# Patient Record
Sex: Female | Born: 1960 | Race: White | Hispanic: No | Marital: Married
Health system: Northeastern US, Academic
[De-identification: ages and names within clinical notes are randomized; demographics above are authoritative.]

---

## 2019-11-10 ENCOUNTER — Ambulatory Visit: Admit: 2019-11-10 | Payer: PRIVATE HEALTH INSURANCE | Attending: Urology | Primary: Family Medicine

## 2019-11-10 ENCOUNTER — Encounter: Admit: 2019-11-10 | Payer: PRIVATE HEALTH INSURANCE | Attending: Urology | Primary: Family Medicine

## 2019-11-10 DIAGNOSIS — N200 Calculus of kidney: Secondary | ICD-10-CM

## 2019-11-10 DIAGNOSIS — N938 Other specified abnormal uterine and vaginal bleeding: Secondary | ICD-10-CM

## 2019-11-10 DIAGNOSIS — M549 Dorsalgia, unspecified: Secondary | ICD-10-CM

## 2019-11-10 DIAGNOSIS — N926 Irregular menstruation, unspecified: Secondary | ICD-10-CM

## 2019-11-10 DIAGNOSIS — Z87442 Personal history of urinary calculi: Secondary | ICD-10-CM

## 2019-11-10 DIAGNOSIS — R209 Unspecified disturbances of skin sensation: Secondary | ICD-10-CM

## 2019-11-10 DIAGNOSIS — M62830 Muscle spasm of back: Secondary | ICD-10-CM

## 2019-11-10 NOTE — Progress Notes
[facility] + [facility]	Urology Office NoteDate of visit: 2/15/2021Referred by: Scherrie November, MD Reason for consultation:  History of multiple sclerosis and kidney stoneHistory of Present Illness: Joanna Bautista is a 59 y.o. woman with a history of multiple sclerosis.  She has a neurogenic bladder as a result of that.  She had been prescribed Myrbetriq about a year and half ago and that appear to made a really big difference.  She had also had an ultrasound done around that time that suggested kidney stones.She is here to establish care.  She lives locally so would prefer to be seen closer to home.  She and her husband also have a house in [place] that they plan on going to soon.  She denies any change in her urinary symptoms.  The Myrbetriq continues to work well.  She is on a 50 milligram dose of that.  She has not had any flank pain consistent with renal colic.PMH:  has a past medical history of Backache (07/29/2012), Dysfunctional uterine bleeding (01/09/2013), Irregular periods (11/17/2013), Skin sensation disturbance (07/29/2012), and Spasm of back muscles (07/29/2012).PSH:  has a past surgical history that includes Vaginal delivery (04/03/1995) and No past surgeries.Medications: Current Outpatient Medications: ?  adapalene-benzoyl peroxide (EPIDUO) 0.1-2.5 % gel, , Disp: , Rfl: ?  cholecalciferol (VITAMIN D) 1,000 unit tablet, Take by mouth.., Disp: , Rfl: ?  clindamycin (CLEOCIN T) 1 % lotion, , Disp: , Rfl: ?  cyanocobalamin 1000 MCG tablet, Take by mouth.., Disp: , Rfl: ?  dilTIAZem (CARDIZEM CD) 240 mg CD 24 hr extended release capsule, Take 1 capsule (240 mg total) by mouth daily., Disp: 90 capsule, Rfl: 3?  MYRBETRIQ 50 mg extended release tablet 24 hr, TAKE 50 MG BY MOUTH DAILY., Disp: , Rfl: 11?  ocrelizumab (OCREVUS) 30 mg/mL Soln, Inject into the vein.., Disp: , Rfl: ?  RESTASIS 0.05 % ophthalmic emulsion, INSTILL 1 UNIT IN BOTH EYES TWICE A DAY, Disp: , Rfl: 3?  triamcinolone (KENALOG) 0.5 % ointment, APPLY TO AFFECTED AREA TWICE A DAY, Disp: , Rfl: 0?  valACYclovir (VALTREX) 1000 MG tablet, Two tabs bid for one day prn, Disp: 12 tablet, Rfl: 3Allergy: Allergies Allergen Reactions ? Avocado  ? Hay Fever And Allergy Relief  ? Latex, Natural Rubber Rash Social: Social History Socioeconomic History ? Marital status: Married   Spouse name: Not on file ? Number of children: Not on file ? Years of education: Not on file ? Highest education level: Not on file Occupational History ? Not on file Social Needs ? Financial resource strain: Not on file ? Food insecurity   Worry: Not on file   Inability: Not on file ? Transportation needs   Medical: Not on file   Non-medical: Not on file Tobacco Use ? Smoking status: Never Smoker ? Smokeless tobacco: Never Used Substance and Sexual Activity ? Alcohol use: Yes   Comment: social  ? Drug use: No ? Sexual activity: Yes Lifestyle ? Physical activity   Days per week: Not on file   Minutes per session: Not on file ? Stress: Not on file Relationships ? Social Manufacturing systems engineer on phone: Not on file   Gets together: Not on file   Attends religious service: Not on file   Active member of club or organization: Not on file   Attends meetings of clubs or organizations: Not on file   Relationship status: Not on file ? Intimate partner violence   Fear of current or ex partner:  Not on file   Emotionally abused: Not on file   Physically abused: Not on file   Forced sexual activity: Not on file Other Topics Concern ? Not on file Social History Narrative ? Not on file Family: Family History Problem Relation Age of Onset ? Heart disease Other  ? Hypertension Father  ? Sleep apnea Father  ? High cholesterol Father ? Arthritis Father  ? Breast cancer Mother  ? Hypertension Mother  ? High cholesterol Mother  ? Arthritis Mother  Exam: There were no vitals filed for this visit. There is no height or weight on file to calculate BMI.Constitutional & General: Normal development and no deformities. No gross nutritional deficits. Normal habitus.  Well groomed. Well-appearing.Respiratory: No use of accessory muscles. Clear to auscultation bilaterally.Cardiovascular: Regular rate and rhythm. 2+ femoral pulses. No pedal edema. GI/Abdomen: Soft. Non-tender. Non-distended. No mass. No hernia. No scars. No CVA tenderness.Genitourinary:  DeferredLabs: Lab Results Component Value Date  WBC 6.58 09/03/2019  HGB 13.8 09/03/2019  HCT 42.1 09/03/2019  MCV 96.6 09/03/2019  PLT 289 09/03/2019   Chemistry    Component Value Date/Time  NA 137 09/03/2019 1520  K 3.8 09/03/2019 1520  CL 106 09/03/2019 1520  CO2 25 09/03/2019 1520  BUN 18 09/03/2019 1520  CREATININE 0.72 09/03/2019 1520  GLU 87 09/03/2019 1520    Component Value Date/Time  CALCIUM 9.3 09/03/2019 1520  ALKPHOS 101 09/03/2019 1520  AST 18 09/03/2019 1520  ALT 25 09/03/2019 1520  BILITOT 1.0 (H) 09/03/2019 1520  Imaging: I performed a renal ultrasound the office.  There is no evidence of hydronephrosis in either kidney.  Both kidneys were visualized very well.  There is no evidence of renal mass.  There is no evidence of kidney stones although vascular calcifications were noted.  With these calcifications, there was no acoustic shadow a twinkle sign.Impression: Joanna Bautista is a 59 y.o. woman with history of multiple sclerosis, neurogenic bladder, well managed on Myrbetriq.Plan: 1. Neurogenic bladder.  Likely component of detrusor overactivity that has been significantly improved by the beta 3 agonist.  She can continue on this medication.  No additional intervention is necessary at this point.  In addition, her ultrasound demonstrates no hydronephrosis which would certainly suggest good storage in her bladder.2. Question of kidney stones.  I did not see any stones on the ultrasound.  I did see some vascular calcifications and I suspect that perhaps that is what was identified that led to the prior identification of kidney stones.  With that being the case, I do not think she needs to do any specific dietary or fluid intake or medication changes because I do not see any stones.She can follow up in 1 year for ultrasound in the office and symptom check.Tomasa Blase, MDAssistant Professor of UrologyYale Topeka Surgery Center of Medicine[address], [place] [phone]/15/2021

## 2019-11-11 DIAGNOSIS — N3281 Overactive bladder: Secondary | ICD-10-CM

## 2019-12-22 MED ORDER — DILTIAZEM CD 240 MG CAPSULE,EXTENDED RELEASE 24 HR
240 | ORAL_CAPSULE | ORAL | 4 refills | 90.00000 days | Status: AC
Start: 2019-12-22 — End: 2020-10-08

## 2020-03-01 ENCOUNTER — Telehealth: Admit: 2020-03-01 | Payer: PRIVATE HEALTH INSURANCE | Attending: Urology | Primary: Family Medicine

## 2020-03-01 ENCOUNTER — Encounter: Admit: 2020-03-01 | Payer: PRIVATE HEALTH INSURANCE | Attending: Acute Care | Primary: Family Medicine

## 2020-03-01 DIAGNOSIS — N3281 Overactive bladder: Secondary | ICD-10-CM

## 2020-03-01 MED ORDER — MYRBETRIQ 50 MG TABLET,EXTENDED RELEASE
50 mg | ORAL_TABLET | Freq: Every day | ORAL | 4 refills | Status: AC
Start: 2020-03-01 — End: ?

## 2020-03-01 NOTE — Telephone Encounter
Routing message 

## 2020-03-01 NOTE — Telephone Encounter
Patient is requesting a refill on MYRBETRIQ 50 mg extended release tablet 24 hr.  [facility] [place] [facility] patient.

## 2020-03-02 ENCOUNTER — Encounter: Admit: 2020-03-02 | Payer: PRIVATE HEALTH INSURANCE | Primary: Family Medicine

## 2020-03-02 NOTE — Telephone Encounter
Sent mychart message that medication was sent

## 2020-03-02 NOTE — Telephone Encounter
Closing as patient has read her my chart message.

## 2020-03-03 MED ORDER — BEPREVE 1.5 % EYE DROPS
1.5 | OPHTHALMIC | Status: AC
Start: 2020-03-03 — End: ?

## 2020-03-03 MED ORDER — MONTELUKAST 10 MG TABLET
10 | ORAL_TABLET | Freq: Every evening | ORAL | 4 refills | 90.00000 days | Status: AC
Start: 2020-03-03 — End: 2022-03-29

## 2020-03-03 MED ORDER — IBUPROFEN 800 MG TABLET
800 | ORAL | 1.00 refills | 15.00000 days | Status: AC
Start: 2020-03-03 — End: ?

## 2020-08-31 ENCOUNTER — Encounter: Admit: 2020-08-31 | Payer: PRIVATE HEALTH INSURANCE | Attending: Urology | Primary: Family Medicine

## 2020-08-31 NOTE — Telephone Encounter
Found in past medications

## 2020-08-31 NOTE — Telephone Encounter
Patient called in. She needs a 90 day supply prescription [facility] to [facility] for [facility]q. Joanna Bautista

## 2020-09-01 ENCOUNTER — Encounter: Admit: 2020-09-01 | Payer: PRIVATE HEALTH INSURANCE | Attending: Acute Care | Primary: Family Medicine

## 2020-09-01 DIAGNOSIS — N3281 Overactive bladder: Secondary | ICD-10-CM

## 2020-09-01 MED ORDER — MYRBETRIQ 50 MG TABLET,EXTENDED RELEASE
50 mg | ORAL_TABLET | Freq: Every day | ORAL | 6 refills | Status: AC
Start: 2020-09-01 — End: 2020-09-03

## 2020-09-03 ENCOUNTER — Encounter: Admit: 2020-09-03 | Payer: PRIVATE HEALTH INSURANCE | Attending: Acute Care | Primary: Family Medicine

## 2020-09-03 DIAGNOSIS — N3281 Overactive bladder: Secondary | ICD-10-CM

## 2020-09-03 MED ORDER — MYRBETRIQ 50 MG TABLET,EXTENDED RELEASE
50 mg | ORAL_TABLET | Freq: Every day | ORAL | 4 refills | Status: AC
Start: 2020-09-03 — End: ?

## 2020-09-03 NOTE — Telephone Encounter
Joanna Bautista states she needs a correction for the prescription for myrbetriq she states she need 90 day supply, a 30 day, she now needs a 60 day supply [facility] to [facility] [phone] , she already has picked up a 30 day supply however she now 60 day rx needs the full 90 day in order for her insurance to cover. Please advise she can be reached anytime at  [phone], please leave a detailed message if she is unable to answer.

## 2020-09-03 NOTE — Telephone Encounter
Pt is calling regarding a 90day supples pharmacy only gave her 30 her insurance will only cover 90day supple can she have the other 60 day supple called. Once completed please call her at [phone]

## 2020-09-03 NOTE — Telephone Encounter
LM for Marcell Anger Tardiff; additional medication prescription requested has [facility] [place]; please check with pharmacy for availability. Call office back with any questions or concerns.

## 2020-09-06 NOTE — Telephone Encounter
Sp[facility] with [facility]th Arnold [facility]; Aziza Stuckert Pelfrey picked up prescription on 12/10. Closing encounter.

## 2020-10-08 MED ORDER — DILTIAZEM CD 240 MG CAPSULE,EXTENDED RELEASE 24 HR
240 | ORAL_CAPSULE | Freq: Every day | ORAL | 4 refills | 90.00000 days | Status: AC
Start: 2020-10-08 — End: 2021-10-24

## 2020-10-11 ENCOUNTER — Encounter: Admit: 2020-10-11 | Payer: PRIVATE HEALTH INSURANCE | Attending: Urology | Primary: Family Medicine

## 2020-10-11 NOTE — Telephone Encounter
[facility]ed over a new RX refill?request. Please have MD review, sign and fax back. The RX refill?request was attached to this encounter and can be found under media.

## 2020-10-12 NOTE — Telephone Encounter
Will fax once form signed.

## 2020-10-12 NOTE — Telephone Encounter
Form printed and given to Dr. Laveda Norman for signature.

## 2020-10-15 ENCOUNTER — Encounter: Admit: 2020-10-15 | Payer: PRIVATE HEALTH INSURANCE | Attending: Urology | Primary: Family Medicine

## 2020-11-30 MED ORDER — LISINOPRIL 10 MG TABLET
10 | ORAL_TABLET | Freq: Every day | ORAL | 4 refills | 90.00000 days | Status: AC
Start: 2020-11-30 — End: ?

## 2021-01-25 ENCOUNTER — Encounter: Admit: 2021-01-25 | Payer: PRIVATE HEALTH INSURANCE | Primary: Family Medicine

## 2021-02-28 ENCOUNTER — Ambulatory Visit: Admit: 2021-02-28 | Payer: PRIVATE HEALTH INSURANCE | Attending: Urology | Primary: Family Medicine

## 2021-03-02 ENCOUNTER — Ambulatory Visit: Admit: 2021-03-02 | Payer: PRIVATE HEALTH INSURANCE | Attending: Urology | Primary: Family Medicine

## 2021-03-14 ENCOUNTER — Telehealth: Admit: 2021-03-14 | Payer: PRIVATE HEALTH INSURANCE | Attending: Urology | Primary: Family Medicine

## 2021-03-14 NOTE — Telephone Encounter
Spoke with Joanna Bautista; she and her husband just tested positive for Covid. Will cancel 6/22 appt and she will call back to reschedule when feeling better.

## 2021-03-15 ENCOUNTER — Inpatient Hospital Stay: Admit: 2021-03-15 | Discharge: 2021-03-15 | Payer: PRIVATE HEALTH INSURANCE

## 2021-03-15 MED ORDER — SODIUM CHLORIDE 0.9 % (FLUSH) INJECTION SYRINGE
0.9 % | Status: DC | PRN
Start: 2021-03-15 — End: 2021-03-15

## 2021-03-15 MED ORDER — EPINEPHRINE 0.3 MG/0.3 ML INJECTION, AUTO-INJECTOR
0.3 mg/ mL | INTRAMUSCULAR | Status: DC | PRN
Start: 2021-03-15 — End: 2021-03-15

## 2021-03-15 MED ORDER — FAMOTIDINE 4 MG/ML IN 0.9% SODIUM CHLORIDE (ADULT)
Freq: Once | INTRAVENOUS | Status: DC | PRN
Start: 2021-03-15 — End: 2021-03-15

## 2021-03-15 MED ORDER — DIPHENHYDRAMINE 50 MG/ML INJECTION (WRAPPED E-RX)
50 mg/mL | Freq: Once | INTRAVENOUS | Status: DC | PRN
Start: 2021-03-15 — End: 2021-03-15

## 2021-03-15 MED ORDER — SODIUM CHLORIDE 0.9 % BOLUS (NEW BAG)
0.9 % | Freq: Once | INTRAVENOUS | Status: DC | PRN
Start: 2021-03-15 — End: 2021-03-15

## 2021-03-15 MED ORDER — ALBUTEROL SULFATE HFA 90 MCG/ACTUATION AEROSOL INHALER
90 mcg/actuation | RESPIRATORY_TRACT | Status: DC | PRN
Start: 2021-03-15 — End: 2021-03-15

## 2021-03-15 MED ORDER — BEBTELOVIMAB 175 MG/2 ML (87.5 MG/ML) INTRAVENOUS SOLUTION (UNAPP)
175 mg/2 mL (87.5 mg/mL) | Freq: Once | INTRAVENOUS | Status: CP
Start: 2021-03-15 — End: ?
  Administered 2021-03-15: 13:00:00 175 mL via INTRAVENOUS

## 2021-03-15 MED ORDER — HYDROCORTISONE SODIUM SUCCINATE 100 MG SOLUTION FOR INJECTION
100 mg | Freq: Once | INTRAVENOUS | Status: DC | PRN
Start: 2021-03-15 — End: 2021-03-15

## 2021-03-15 NOTE — ED Notes
9:36 AM pt tolerated med without difficulty. Vss. Ready for discharge.

## 2021-03-16 ENCOUNTER — Ambulatory Visit: Admit: 2021-03-16 | Payer: PRIVATE HEALTH INSURANCE | Attending: Urology | Primary: Family Medicine

## 2021-04-16 ENCOUNTER — Emergency Department: Admit: 2021-04-16 | Payer: PRIVATE HEALTH INSURANCE | Primary: Family Medicine

## 2021-04-16 ENCOUNTER — Inpatient Hospital Stay: Admit: 2021-04-16 | Discharge: 2021-04-16 | Payer: PRIVATE HEALTH INSURANCE

## 2021-04-16 ENCOUNTER — Encounter: Admit: 2021-04-16 | Payer: PRIVATE HEALTH INSURANCE | Primary: Family Medicine

## 2021-04-16 DIAGNOSIS — Z91018 Allergy to other foods: Secondary | ICD-10-CM

## 2021-04-16 DIAGNOSIS — Z8249 Family history of ischemic heart disease and other diseases of the circulatory system: Secondary | ICD-10-CM

## 2021-04-16 DIAGNOSIS — M549 Dorsalgia, unspecified: Secondary | ICD-10-CM

## 2021-04-16 DIAGNOSIS — Z91040 Latex allergy status: Secondary | ICD-10-CM

## 2021-04-16 DIAGNOSIS — M62830 Muscle spasm of back: Secondary | ICD-10-CM

## 2021-04-16 DIAGNOSIS — N938 Other specified abnormal uterine and vaginal bleeding: Secondary | ICD-10-CM

## 2021-04-16 DIAGNOSIS — R209 Unspecified disturbances of skin sensation: Secondary | ICD-10-CM

## 2021-04-16 DIAGNOSIS — N926 Irregular menstruation, unspecified: Secondary | ICD-10-CM

## 2021-04-16 DIAGNOSIS — Z8616 Personal history of COVID-19: Secondary | ICD-10-CM

## 2021-04-16 DIAGNOSIS — H9209 Otalgia, unspecified ear: Secondary | ICD-10-CM

## 2021-04-16 DIAGNOSIS — R0602 Shortness of breath: Secondary | ICD-10-CM

## 2021-04-16 DIAGNOSIS — R6884 Jaw pain: Secondary | ICD-10-CM

## 2021-04-16 DIAGNOSIS — R079 Chest pain, unspecified: Secondary | ICD-10-CM

## 2021-04-16 DIAGNOSIS — Z79899 Other long term (current) drug therapy: Secondary | ICD-10-CM

## 2021-04-16 LAB — BASIC METABOLIC PANEL
BKR ANION GAP (LM): 5 mmol/L (ref 5–15)
BKR BLOOD UREA NITROGEN: 10 mg/dL (ref 7–18)
BKR CALCIUM: 9.3 mg/dL (ref 8.5–10.1)
BKR CHLORIDE: 103 mmol/L (ref 98–107)
BKR CO2: 26 mmol/L (ref 21–32)
BKR CREATININE: 0.7 mg/dL (ref 0.55–1.02)
BKR EGFR (AFR AMER) (LMC): >60 mL/min/{1.73_m2} (ref >60)
BKR EGFR (NON AFR AMER) (LMC): >60 mL/min/{1.73_m2} (ref >60)
BKR GLUCOSE: 96 mg/dL (ref 65–110)
BKR POTASSIUM: 3.8 mmol/L (ref 3.5–5.1)
BKR SODIUM: 134 mmol/L — ABNORMAL LOW (ref 136–145)

## 2021-04-16 LAB — CBC WITH AUTO DIFFERENTIAL
BKR WAM ABSOLUTE IMMATURE GRANULOCYTES.: 0.01 x 1000/ÂµL (ref 0.00–0.30)
BKR WAM ABSOLUTE LYMPHOCYTE COUNT.: 1.55 x 1000/ÂµL (ref 0.60–3.70)
BKR WAM ABSOLUTE NEUTROPHIL COUNT.: 4.96 x 1000/ÂµL (ref 2.00–7.60)
BKR WAM BASOPHIL ABSOLUTE COUNT.: 0.03 x 1000/ÂµL (ref 0.00–1.00)
BKR WAM BASOPHILS: 0.4 % (ref 0.0–1.4)
BKR WAM EOSINOPHIL ABSOLUTE COUNT.: 0.01 x 1000/ÂµL (ref 0.00–1.00)
BKR WAM EOSINOPHILS: 0.1 % (ref 0.0–5.0)
BKR WAM HEMATOCRIT (2 DEC): 41.6 % (ref 35.00–45.00)
BKR WAM HEMOGLOBIN: 13.9 g/dL (ref 11.7–15.5)
BKR WAM IMMATURE GRANULOCYTES: 0.1 % (ref 0.0–1.0)
BKR WAM LYMPHOCYTES: 21.6 % (ref 17.0–50.0)
BKR WAM MCH PG: 32.2 pg (ref 27.0–33.0)
BKR WAM MCHC: 33.4 g/dL (ref 31.0–36.0)
BKR WAM MCV: 96.3 fL (ref 80.0–100.0)
BKR WAM MONOCYTE ABSOLUTE COUNT.: 0.6 x 1000/ÂµL (ref 0.00–1.00)
BKR WAM MONOCYTES: 8.4 % (ref 4.0–12.0)
BKR WAM MPV: 9.8 fL (ref 8.0–12.0)
BKR WAM NEUTROPHILS: 69.4 % (ref 39.0–72.0)
BKR WAM NUCLEATED RED BLOOD CELLS: 0 % (ref 0.0–1.0)
BKR WAM PLATELETS: 290 x1000/ÂµL (ref 150–420)
BKR WAM RDW-CV: 11.8 % (ref 11.0–15.0)
BKR WAM RED BLOOD CELL COUNT.: 4.32 M/ÂµL (ref 4.00–6.00)
BKR WAM WHITE BLOOD CELL COUNT: 7.2 x1000/ÂµL (ref 4.0–11.0)

## 2021-04-16 LAB — TROPONIN T HIGH SENSITIVITY, 0 HOUR BASELINE WITH REFLEX (BH GH LMW YH): BKR TROPONIN T HS 0 HOUR BASELINE: <6 ng/L

## 2021-04-16 MED ORDER — CLINDAMYCIN HCL 150 MG CAPSULE
150 mg | ORAL_CAPSULE | Freq: Four times a day (QID) | ORAL | 1 refills | Status: AC
Start: 2021-04-16 — End: ?

## 2021-04-16 NOTE — Discharge Instructions
Continue use of albuterol inhaler and Flonase. Take all medications as prescribed. Follow up as instructed. Seek immediate medical attention for the following:  Fever, chills, swelling of the face mouth or throat, difficulty swallowing, difficulty breathing, chest pain, abdominal pain, nausea or vomiting, or any concerns.

## 2021-04-16 NOTE — ED Notes
6:04 PM PA Hoover at bedside for eval

## 2021-04-16 NOTE — ED Provider Notes
CHIEF COMPLAINTEar problem and chest pain HPIAngela C Bautista is a 60 y.o. female  who presents to the ED c/o ear problem and chest pain.  Patient presents with history of COVID-19 infection proximally 4 weeks ago.  Patient reports intermittent coughing with feeling shortness of breath and need for use of inhaler.  Patient also reports ongoing evaluation for dental infection with need for root canal and extraction to the left lower molar.  Patient reports ongoing evaluation by dentist with recent 7 day course of antibiotics.  Patient also reports history of chronic sinusitis with nasal congestion postnasal drip with ear fullness.  Patient reports use of over-the-counter medications for management but denies prior ENT evaluation.  Patient presents today with concern for left-sided jaw pain with fever.  States over the past few days she has been not feel like she was harboring something.  Reports negative COVID test as outpatient.  Reports a fever of approximately 100F at home with use of Tylenol.  Reports history of MS with immunotherapy with concern for potential systemic infection and therefore came to be evaluated.  Patient reports mild nausea associated with symptoms.  Denies abdominal pain, emesis, diarrhea, or change in urination.  Denies chest pain or difficulty breathing at this time.  Denies facial or neck fullness, swelling, drooling, or difficulty swallowing. I have reviewed the following from the nursing documentation.  Past Medical History: Diagnosis Date ? Backache 07/29/2012 ? Dysfunctional uterine bleeding 01/09/2013 ? Irregular periods 11/17/2013 ? Skin sensation disturbance 07/29/2012 ? Spasm of back muscles 07/29/2012 Past Surgical History: Procedure Laterality Date ? NO PAST SURGERIES   ? VAGINAL DELIVERY  04/03/1995  L&M Family History Problem Relation Age of Onset ? Heart disease Other  ? Hypertension Father  ? Sleep apnea Father  ? High cholesterol Father  ? Arthritis Father  ? Breast cancer Mother  ? Hypertension Mother  ? High cholesterol Mother  ? Arthritis Mother  Social History Socioeconomic History ? Marital status: Married   Spouse name: Not on file ? Number of children: Not on file ? Years of education: Not on file ? Highest education level: Not on file Occupational History ? Not on file Tobacco Use ? Smoking status: Never Smoker ? Smokeless tobacco: Never Used Vaping Use ? Vaping Use: Never used Substance and Sexual Activity ? Alcohol use: Yes   Comment: social  ? Drug use: No ? Sexual activity: Yes Other Topics Concern ? Not on file Social History Narrative ? Not on file Social Determinants of Health Financial Resource Strain: Not on file Food Insecurity: Not on file Transportation Needs: Not on file Physical Activity: Not on file Stress: Not on file Social Connections: Not on file Intimate Partner Violence: Not on file Housing Stability: Not on file No current facility-administered medications for this encounter. Current Outpatient Medications Medication Sig Dispense Refill ? adapalene-benzoyl peroxide (EPIDUO) 0.1-2.5 % gel    ? BEPREVE 1.5 % Drop INSTILL 1 DROP INTO BOTH EYES TWICE A DAY   ? cholecalciferol (VITAMIN D) 1,000 unit tablet Take by mouth..   ? clindamycin (CLEOCIN T) 1 % lotion    ? clindamycin (CLEOCIN) 150 mg capsule Take 3 capsules (450 mg total) by mouth every 6 (six) hours for 7 days. 84 capsule 0 ? cyanocobalamin 1000 MCG tablet Take by mouth..   ? dilTIAZem CD (CARDIZEM CD) 240 mg 24 hr capsule Take 1 capsule (240 mg total) by mouth daily. 90 capsule 3 ? ibuprofen (ADVIL,MOTRIN) 800 mg tablet TAKE 1 TABLET BY  MOUTH EVERY 8 HOURS AS NEEDED FOR PAIN   ? lisinopriL (PRINIVIL,ZESTRIL) 10 mg tablet Take 1 tablet (10 mg total) by mouth daily. (Patient taking differently: Take 20 mg by mouth daily.) 90 tablet 3 ? mirabegron (MYRBETRIQ) 50 mg extended release tablet 24 hr Take 1 tablet (50 mg total) by mouth daily. (Patient taking differently: Take 25 mg by mouth daily.) 90 tablet 3 ? montelukast (SINGULAIR) 10 mg tablet Take 1 tablet (10 mg total) by mouth nightly. (Patient not taking: Reported on 03/29/2021) 30 tablet 3 ? ocrelizumab (OCREVUS) 30 mg/mL Soln Inject into the vein..   ? RESTASIS 0.05 % ophthalmic emulsion INSTILL 1 UNIT IN BOTH EYES TWICE A DAY  3 ? triamcinolone (KENALOG) 0.5 % ointment APPLY TO AFFECTED AREA TWICE A DAY  0 ? valACYclovir (VALTREX) 1000 MG tablet Two tabs bid for one day prn 12 tablet 3 Allergies Allergen Reactions ? Avocado  ? Hay Fever And Allergy Relief  ? Latex, Natural Rubber Rash ROSA total of 6 systems have been reviewed. All positives and pertinent negatives as per the HPI. PHYSICAL EXAMBP (!) 150/67  - Pulse 74  - Temp 98.6 ?F (37 ?C) (Oral)  - Resp 20  - SpO2 96%  GENERAL APPEARANCE: A 60 y.o., alert and well developed female in no acute distress. HEENT: Normocephalic. Atraumatic. Eyes are anicteric with no injection. No facial asymmetry. Neck is supple with full range of motion.  No gingival edema, dental abscess or dental carie noted.  No trismus.  Able to tolerate oral secretions.  Tympanic membranes pearly gray without effusion, erythema or bulging bilaterally.  No nuchal rigidity.Cardiovascular: Heart sounds S1 and S2 are present with regular rate and rhythm. No murmurs, gallops or rubs are heard upon auscultation. Good capillary refill with no evidence of cyanosis.  Radial peripheral pulses present and equal 2/4 bilaterally. LUNGS: Respirations unlabored. Breath sounds are clear and equal bilaterally on auscultation with no adventitious sounds. ABDOMEN: Soft, non-tender without guarding or peritoneal signs. No organomegaly noted.MUSCULOSKELETAL: Atraumatic x4. No peripheral edema. SKIN: Warm and dry without acute ecchymosis or erythema. NEUROLOGICAL: Alert and oriented x4 with no gross sensory deficits.PSYCHIATRIC: Calm and pleasant affect, cooperative and interactive with staff.ED COURSE:The patient was stable during the ED course. Relevant old records, labs and imaging are reviewed. During the patient's ED course, the patient was given:Medications - No data to display LABSI have reviewed all available labs for this visit prior to discharge.Results for orders placed or performed during the hospital encounter of 04/16/21 Troponin T High Sensitivity, Emergency; 0 hour baseline AND 1 hour with reflex (3 hour) Result Value Ref Range  High Sensitivity Troponin T <6 See Comment ng/L CBC auto differential Result Value Ref Range  WBC 7.2 4.0 - 11.0 x1000/?L  RBC 4.32 4.00 - 6.00 M/?L  Hemoglobin 13.9 11.7 - 15.5 g/dL  Hematocrit 16.10 96.04 - 45.00 %  MCV 96.3 80.0 - 100.0 fL  MCH 32.2 27.0 - 33.0 pg  MCHC 33.4 31.0 - 36.0 g/dL  RDW-CV 54.0 98.1 - 19.1 %  Platelets 290 150 - 420 x1000/?L  MPV 9.8 8.0 - 12.0 fL  Neutrophils 69.4 39.0 - 72.0 %  Lymphocytes 21.6 17.0 - 50.0 %  Monocytes 8.4 4.0 - 12.0 %  Eosinophils 0.1 0.0 - 5.0 %  Basophil 0.4 0.0 - 1.4 %  Immature Granulocytes 0.1 0.0 - 1.0 %  nRBC 0.0 0.0 - 1.0 %  ANC (Abs Neutrophil Count) 4.96 2.00 - 7.60 x 1000/?L  Absolute Lymphocyte  Count 1.55 0.60 - 3.70 x 1000/?L  Monocyte Absolute Count 0.60 0.00 - 1.00 x 1000/?L  Eosinophil Absolute Count 0.01 0.00 - 1.00 x 1000/?L  Basophil Absolute Count 0.03 0.00 - 1.00 x 1000/?L  Absolute Immature Granulocyte Count 0.01 0.00 - 0.30 x 1000/?L Basic metabolic panel Result Value Ref Range  Glucose 96 65 - 110 mg/dL  BUN 10 7 - 18 mg/dL  Creatinine 2.13 0.86 - 1.02 mg/dL  eGFR (AFRICAN-AMERICAN) >60 >60 mL/min/1.61m2  eGFR (NON African-American) >60 >60 mL/min/1.37m2  Sodium 134 (L) 136 - 145 mmol/L  Potassium 3.8 3.5 - 5.1 mmol/L  Chloride 103 98 - 107 mmol/L  CO2 26 21 - 32 mmol/L  Anion Gap 5 5 - 15 mmol/L  Calcium 9.3 8.5 - 10.1 mg/dL EKG Result Value Ref Range  ECG - HEART RATE 92 bpm  ECG - QRS Interval 82 ms  ECG - QT Interval 332 ms  ECG - QTC Interval 411 ms  ECG - P Axis 85 deg  ECG - QRS Axis 29 deg  ECG - T Wave Axis 15 deg  ECG -- P-R Interval 108 msec  ECG - SEVERITY Abnormal ECG severity  RADIOLOGYCXR reviewed by myself without evidence of acute pathology.EKG Reviewed with evidence of sinus tachycardia with PACs at 92 beats per minute.  PR interval 108.  QTC 411.  No evidence of STEMI or ischemia.  Reviewed by attending Dr. Biagio Quint. MDM:The patient presents c/o ear problem and chest pain. PMH reviewed with significant findings of MS.  Prior cardiac evaluation from March of 2022 reviewed with negative stress test. Patient is afebrile, nontoxic and in no acute distress.  Physical examination as detailed above. No evidence of dental abscess requiring incision drainage or Ludwig's angina.  Lungs clear bilaterally.  No evidence of acute respiratory distress.  Chest x-ray reviewed without evidence of acute pathology.  CBC and BMP unremarkable.  Initial troponin <6 and modified heart score 2. With symptoms ongoing for >3 hours no indication for further testing per [facility] cardiac pathway and acute MI ruled out at this time.  Concern for potential ongoing dental infection with plan for secondary course of clindamycin.  Patient additionally advised on supportive care for shortness breath and cough with use of inhaler.  Patient provided with dental and cardiac referral. Advised on close follow up with PCP. Patient discharged home with close follow up planned with PCP within 4 days. Indications for return reviewed with patient, who was in agreement with this plan. ED CourseCLINICAL IMPRESSION  SNOMED [place](R) 1. Jaw pain  JAW PAIN DISPOSITIONAngela C Bautista was discharged to home in stable condition.They have been instructed to follow-up with Scherrie November, MD[address] [phone] an appointment as soon as possible for a visit in 4 daysSanfilippo, [place], DMD[address] [place] [phone] your dentistVelankar, Placido Sou, MD[address] [phone] an appointment as soon as possible for a visit Cardiology follow upSupervision:This patient was seen independently.My supervising physician was available for consultation. DISCLAIMER: This chart was created using M-Modal dictation software. Efforts were made by me to ensure accuracy, however some errors may be present due to limitations of this technology and occasionally words are not transcribed as intended. Daron Offer, PA07/23/22 [phone]

## 2021-04-27 MED ORDER — ALPRAZOLAM 0.5 MG TABLET
0.5 | ORAL_TABLET | ORAL | 1 refills | 25.00000 days | Status: AC
Start: 2021-04-27 — End: 2022-03-29

## 2021-08-31 IMAGING — MG MAMMOGRAPHY SCREENING BILATERAL 3[PERSON_NAME]
8 series · 9 of 24 positions shown · non-contrast
Comparison: Comparison was made to prior examinations.

________________________________________________________________________________________________ 
MAMMOGRAPHY SCREENING BILATERAL 3ARE PUSEY, 08/31/2021 [DATE]: 
CLINICAL INDICATION: Screening.
TECHNIQUE: Digital bilateral mammograms and 3-D Tomosynthesis were obtained. 
These were interpreted both primarily and with the aid of computer-aided 
detection system.  
BREAST DENSITY: (Level B) There are scattered areas of fibroglandular density.

[R CC]
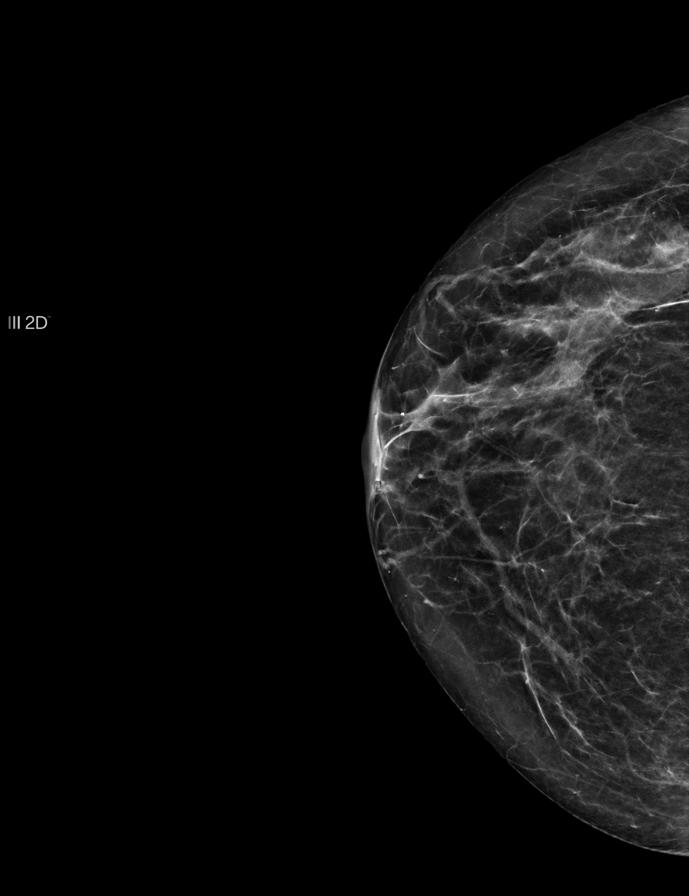

[R MLO]
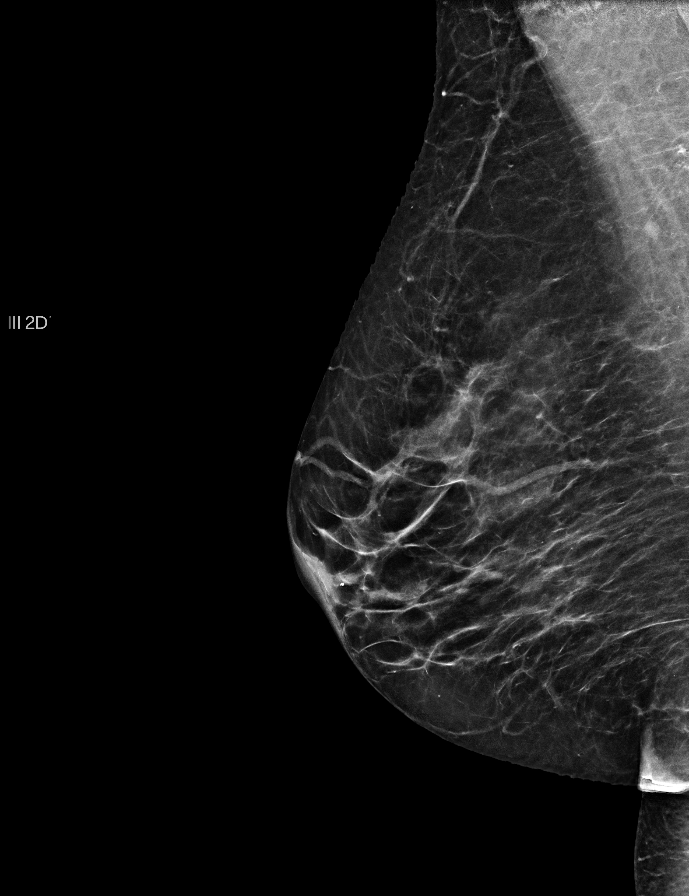

[L MLO]
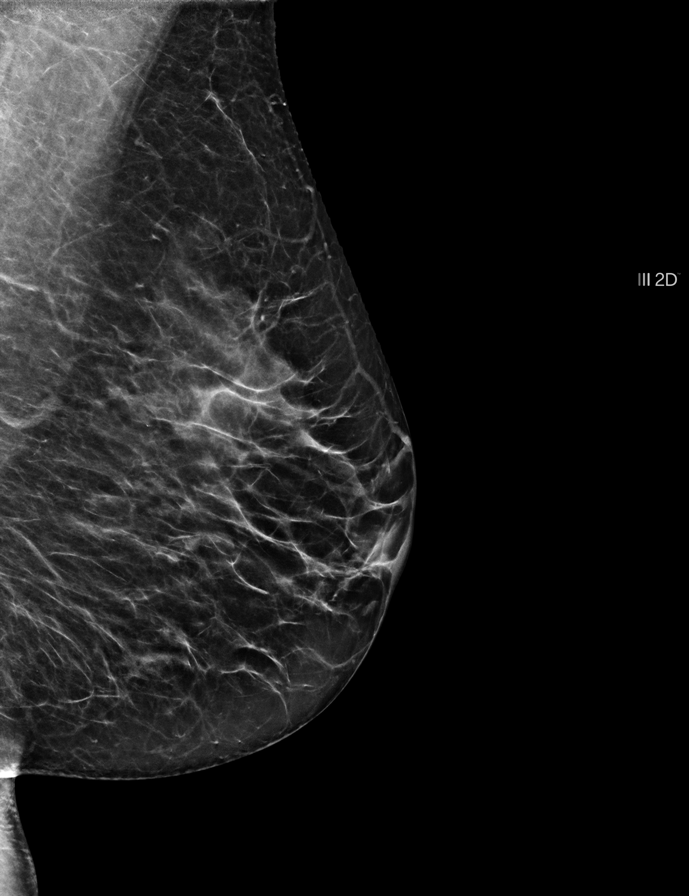

[L CC]
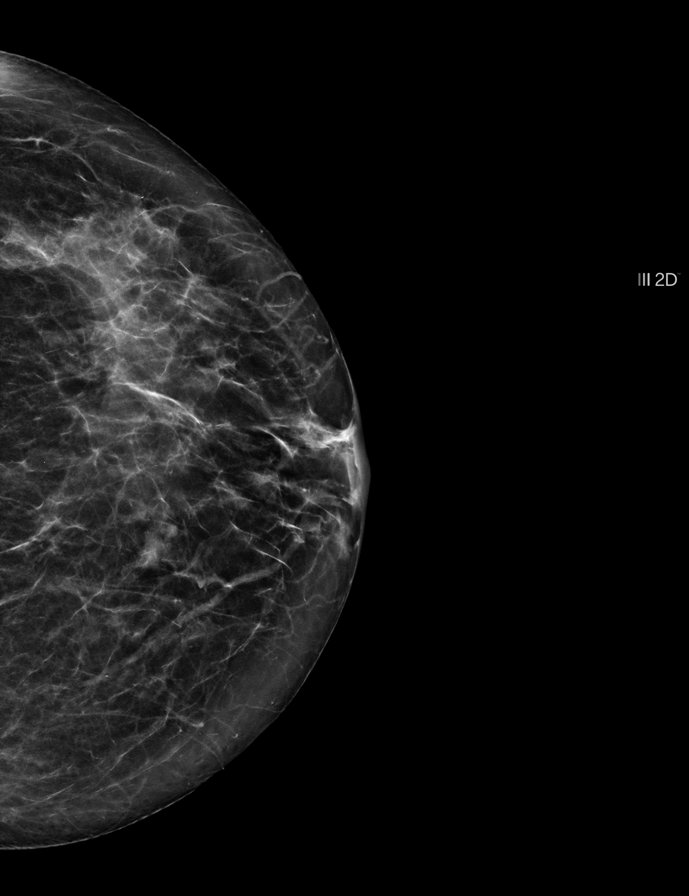

[R CC tomo · 2 of 45 frames shown]
[frame 15/45]
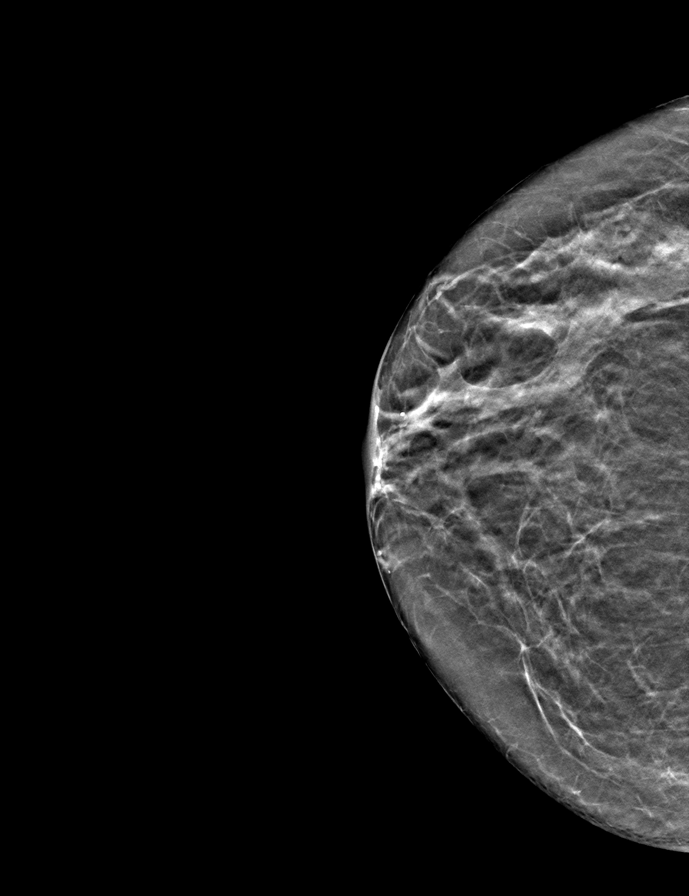
[frame 23/45]
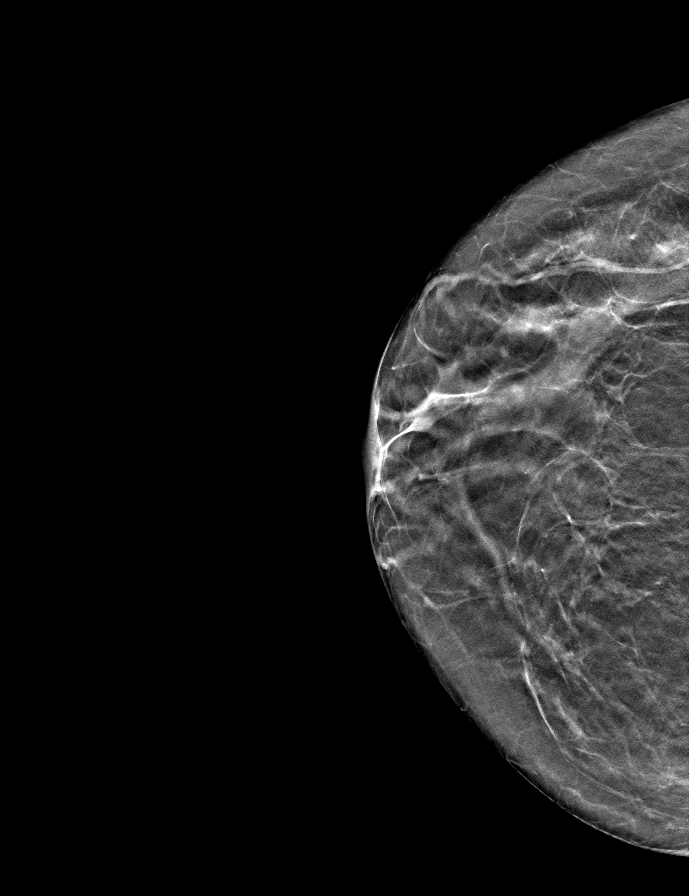

[L CC tomo · tomo slice 23/45.0]
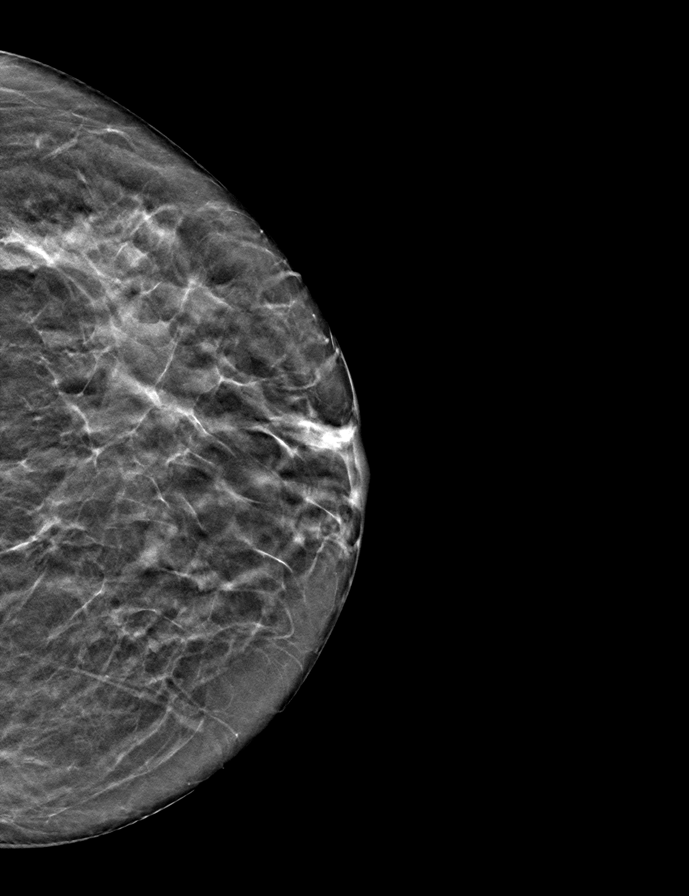

[L MLO tomo · tomo slice 27/54.0]
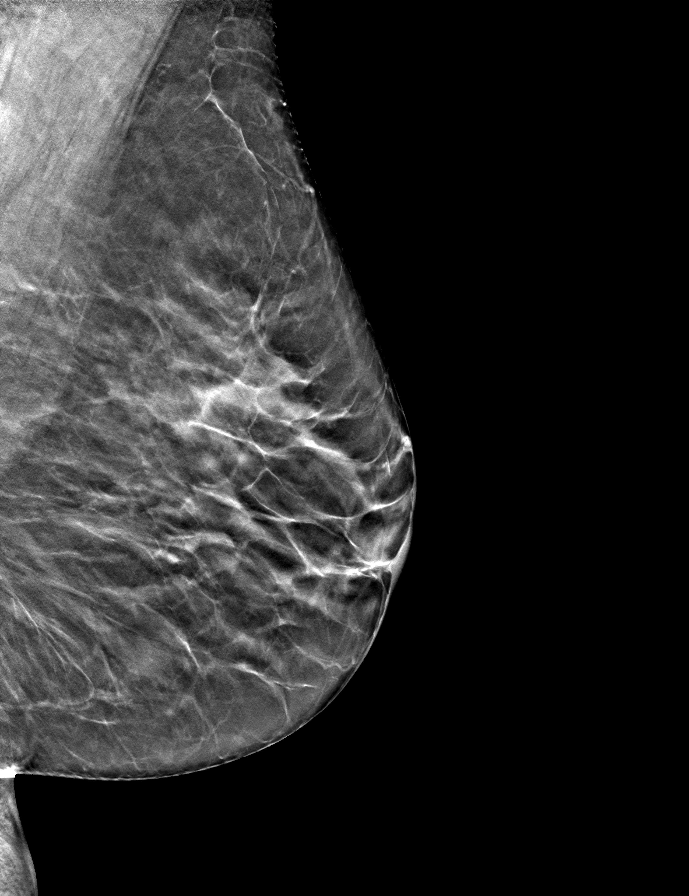

[R MLO tomo · tomo slice 27/52.0]
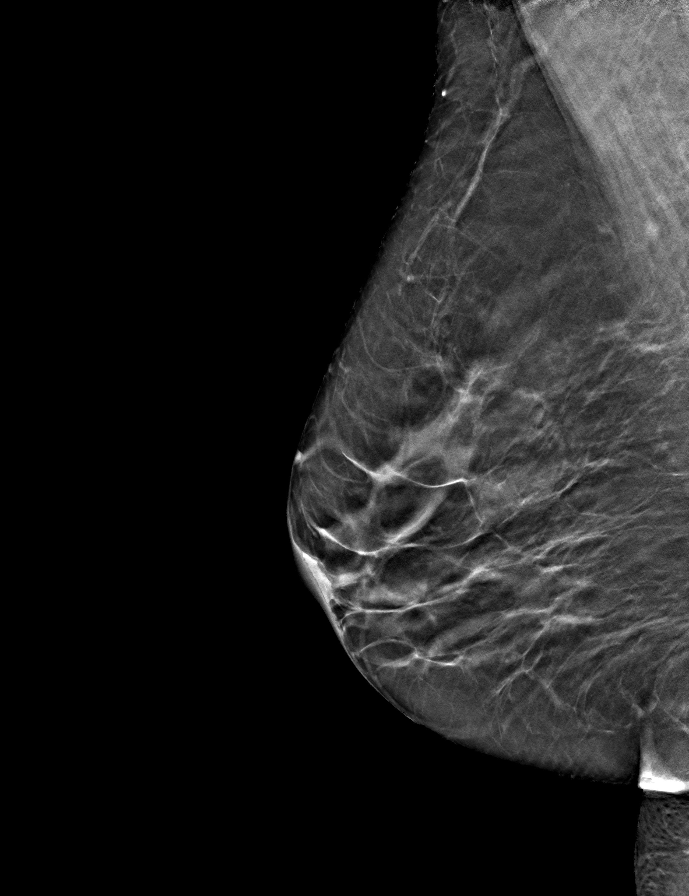

[9 of 24 positions shown; findings below may reference images not displayed]

FINDINGS: Benign calcification. No suspicious mass, calcifications, or area of 
architectural distortion in either breast.
IMPRESSION: Stable mammogram. 
(BI-RADS 2) Benign findings. Routine mammographic follow-up is recommended.

## 2021-08-31 IMAGING — MR MRI CERVICAL SPINE WITHOUT CONTRAST
5 of 7 series · 22 of 48 positions shown · IV contrast (gadolinium)
Comparison: Cervical MRI November 03, 2019 outside facility

________________________________________________________________________________________________ 
MRI CERVICAL SPINE WITHOUT CONTRAST, 08/31/2021 [DATE]: 
CLINICAL INDICATION: Multiple sclerosis diagnosed 10 years ago. No new symptoms
TECHNIQUE: Sagittal T1, Sagittal T2, Sagittal STIR, Axial TSE and Axial [id number] 
images of the cervical spine were performed without intravenous gadolinium 
enhancement.

[Series 101: survey* · axial · 10.0mm · 0.94mm/px · z∈[-30,+150]mm · 6 of 15 slices shown]
[im 1/15]
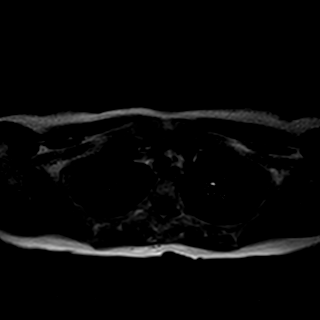
[im 3/15]
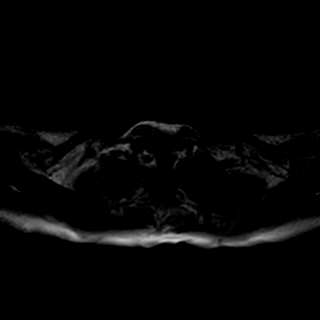
[im 6/15]
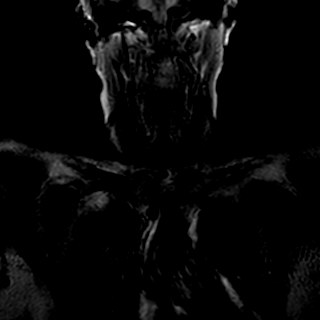
[im 9/15]
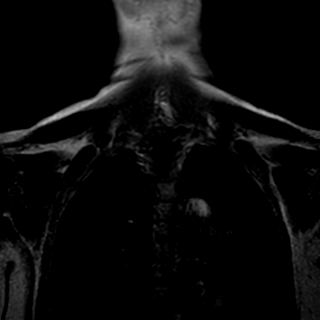
[im 12/15]
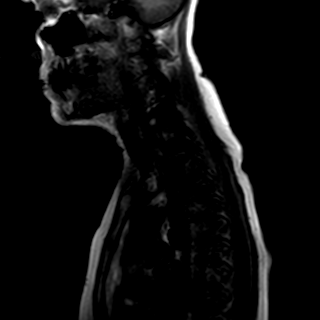
[im 15/15]
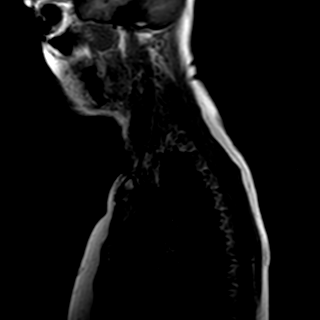

[Series 201: t2w_cor-surv · coronal · 5.0mm · 0.69mm/px · 3 of 7 slices shown]
[im 1/7]
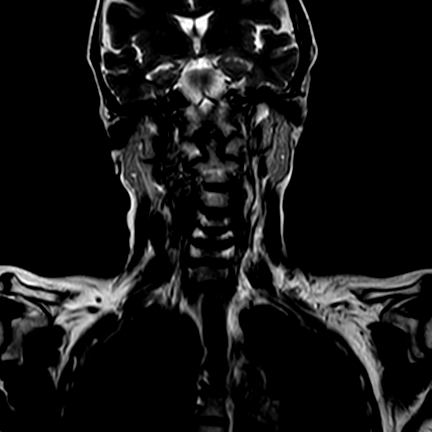
[im 4/7]
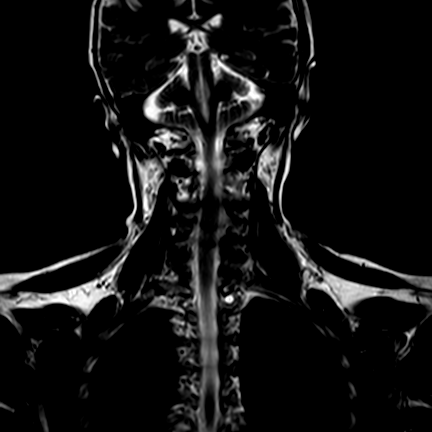
[im 7/7]
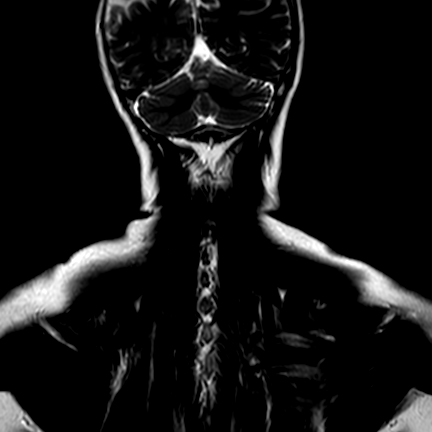

[Series 301: t1_sag · sagittal · 3.0mm · 0.39mm/px · 4 of 8 slices shown (1 of 2)]
[im 1/8]
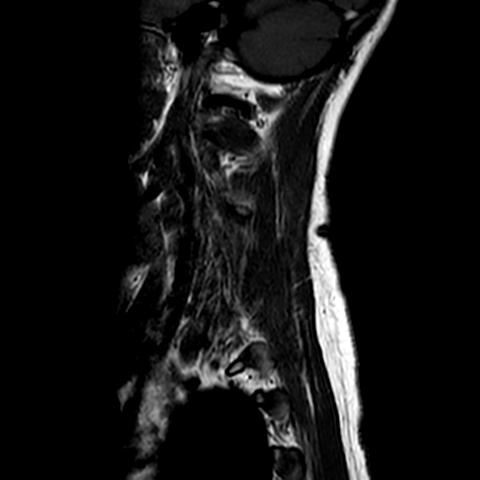
[im 3/8]
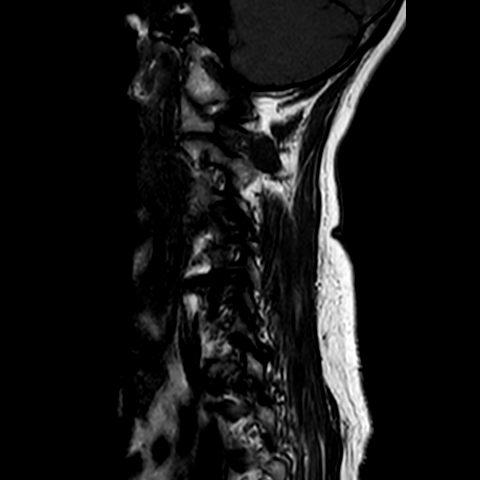
[im 5/8]
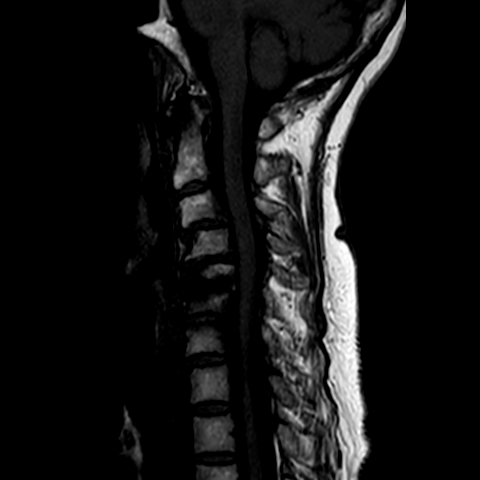
[im 8/8]
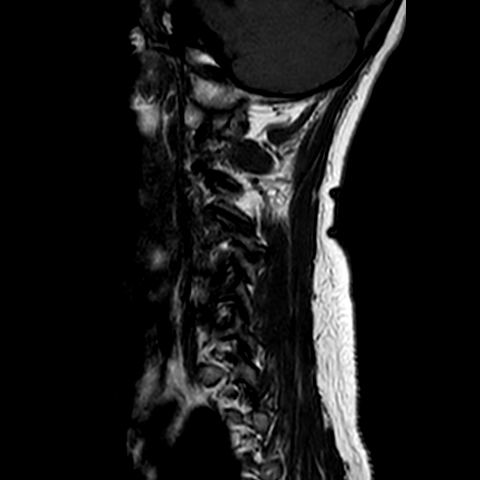

[Series 401: t1_sag · sagittal · 3.0mm · 0.39mm/px · 7 of 15 slices shown (2 of 2)]
[im 1/15]
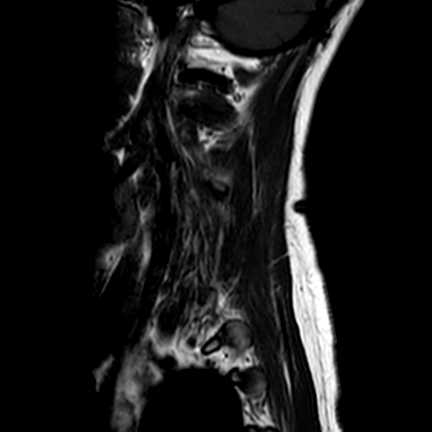
[im 3/15]
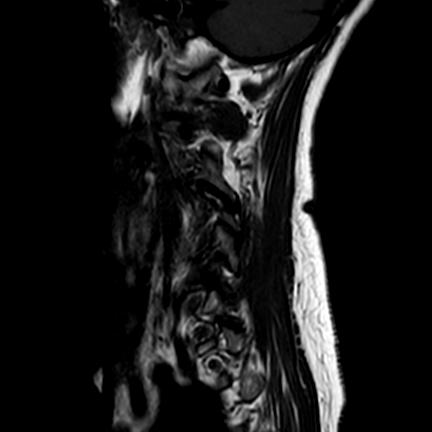
[im 5/15]
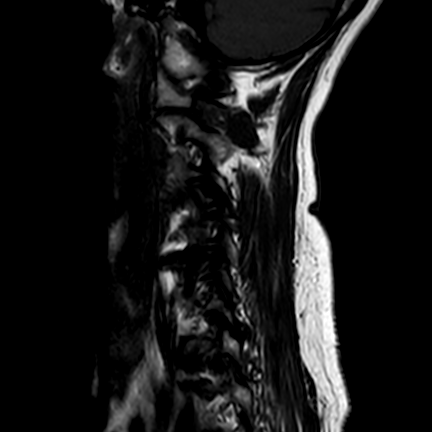
[im 8/15]
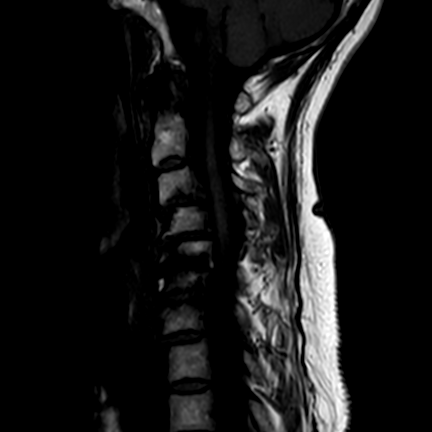
[im 10/15]
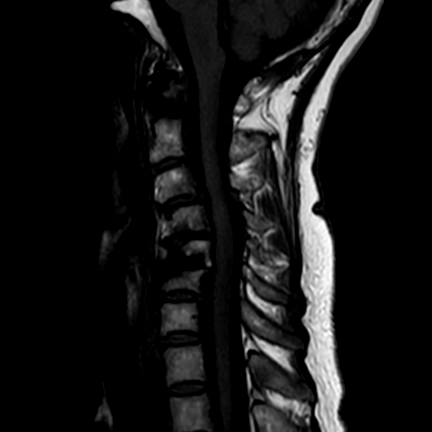
[im 12/15]
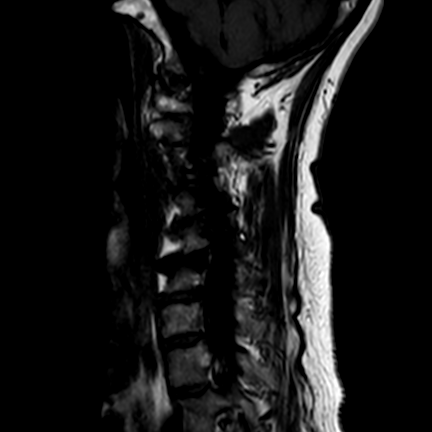
[im 15/15]
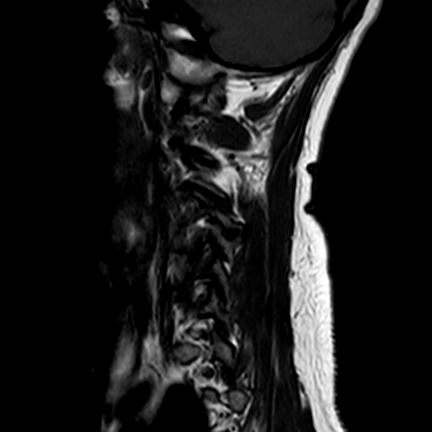

[Series 501: t2w_mv_xd_sag · sagittal · 3.0mm · 0.31mm/px · 2 of 15 slices shown]
[im 1/15]
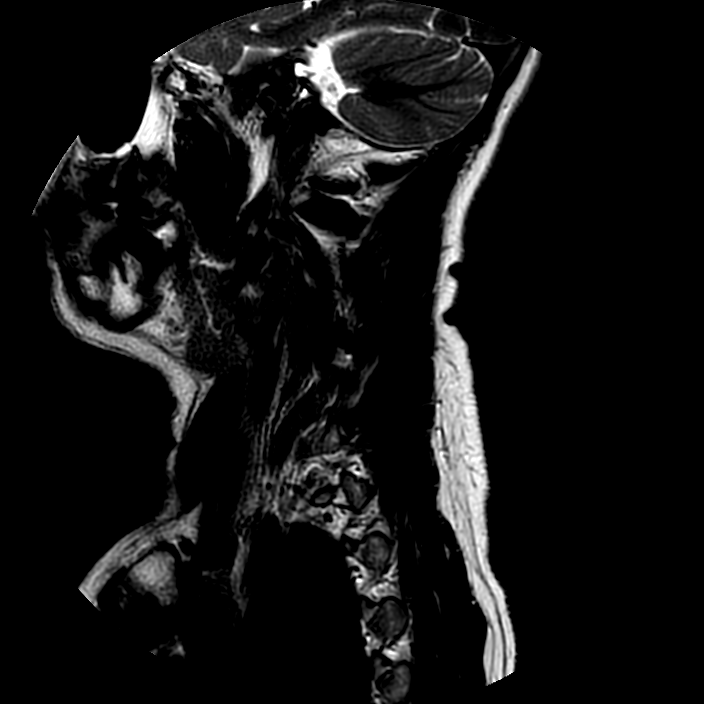
[im 3/15]
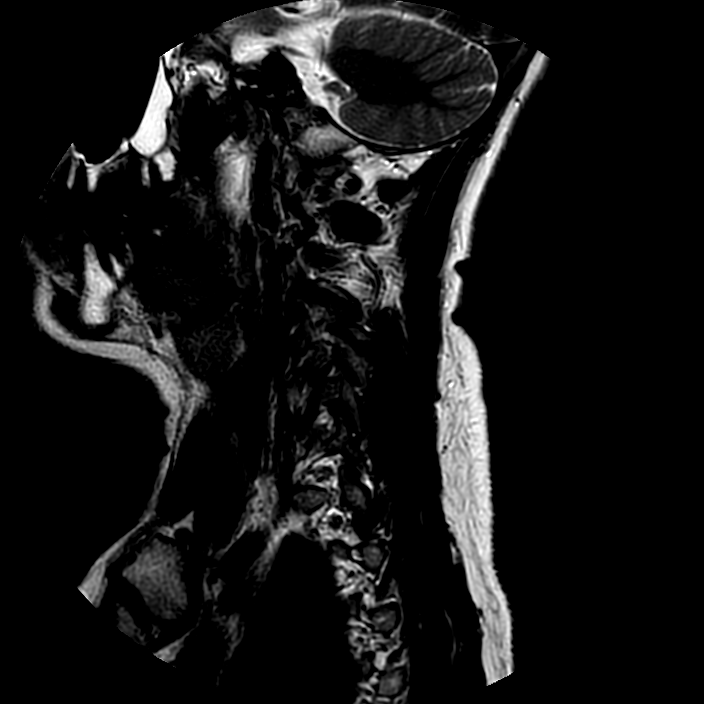

[22 of 48 positions shown; findings below may reference images not displayed]

FINDINGS: Todays study is mildly limited by motion artifact. There is T2 
hyperintense signal in the left lateral cord opposite C3, extending cortically 9 
mm, sagittal T2 image 5, coronal dimension 3 mm, axial T2 image 24. There is 
increased signal in the left lateral cord opposite T1, axial T2 image 3, 
extending 3 mm in AP dimension no other intramedullary signal abnormality. 
Contrast was not administered. 
There is spondylosis. There is grade 1 anterolisthesis at C3-4. Slight 
anterolisthesis at C4-5. Marked disc narrowing at C5-6. Endplate degenerative 
reactive change at C5-6 and C6-7. The dens is intact. Craniocervical junction is 
open. 
Disc bulge approximates the cord at C3-4, canal diameter 8 mm. C4-5 Canal 
diameter 10.5 mm. C5-6 8 mm. 
Axial images show moderate right, mild to moderate left foraminal stenosis at 
C3-4. Mild to moderate right C4-5 foraminal stenosis. Moderate to marked left 
C5-6 foraminal stenosis. Mild left C6-7 foraminal stenosis. Other foramina are 
open. 
There are zygapophyseal facet degenerative changes throughout the cervical 
spine. 
No evidence for spinal malignancy.
IMPRESSION: T2 hyperintense signal in the left lateral cord opposite C3 and T1 compatible 
with chronic demyelinating plaque. Findings are not significant different from 
the prior MRI. 
Moderately advanced spondylosis. There is mild canal stenosis at C3-4 and C5-6. 
Grade 1 anterolisthesis at C3-4. Reversal lordosis and foraminal stenosis as 
described, not different from the prior MRI. 
Todays cranial MRI findings reported separately.

## 2021-08-31 IMAGING — MR MRI BRAIN WITHOUT CONTRAST
4 of 11 series · 17 of 48 positions shown · non-contrast
Comparison: Brain MRI October 2019, nonenhanced, outside facility

________________________________________________________________________________________________ 
MRI BRAIN WITHOUT CONTRAST, 08/31/2021 [DATE]: 
CLINICAL INDICATION: Multiple sclerosis diagnosed 10 years ago, no new symptoms, 
follow-up
TECHNIQUE: Axial T1, Axial T2, Axial FLAIR, Diffusion weighted images, Sagittal 
T1, and Coronal T2 MR images of the brain were performed without intravenous 
contrast enhancement.

[Series 102: mpr - smartbrain · axial · 1.1mm · 1.09mm/px · z∈[+142,+316]mm · 2 of 2 slices shown]
[im 1/2]
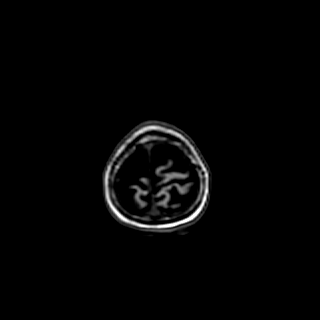
[im 2/2]
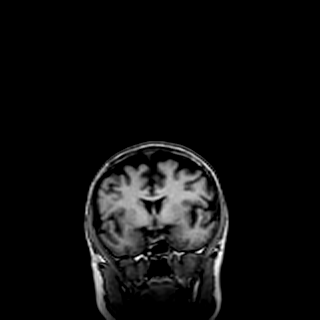

[Series 201: t1_se_sag · sagittal · 4.0mm · 0.43mm/px · 3 of 28 slices shown]
[im 1/28]
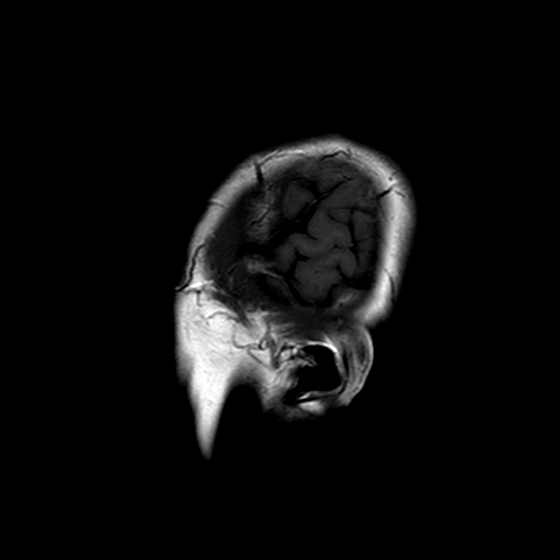
[im 14/28]
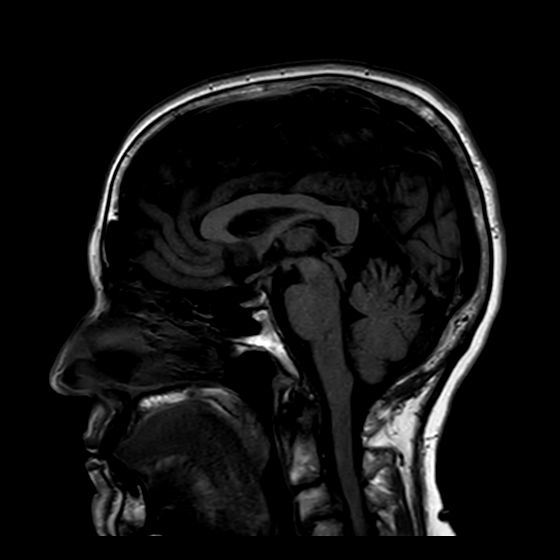
[im 28/28]
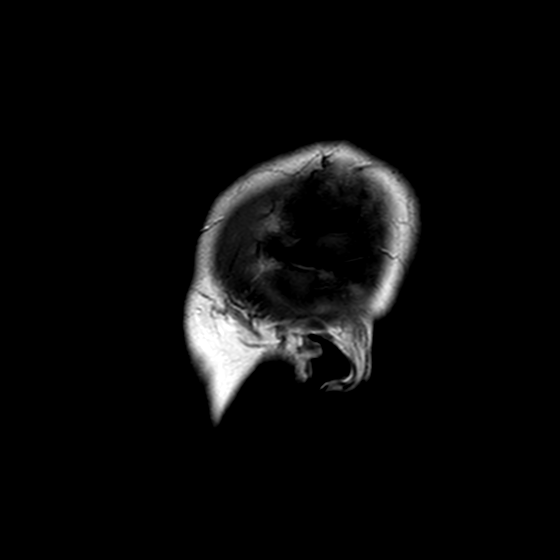

[Series 301: flair_sag · sagittal · 3.0mm · 0.45mm/px · 3 of 40 slices shown]
[im 1/40]
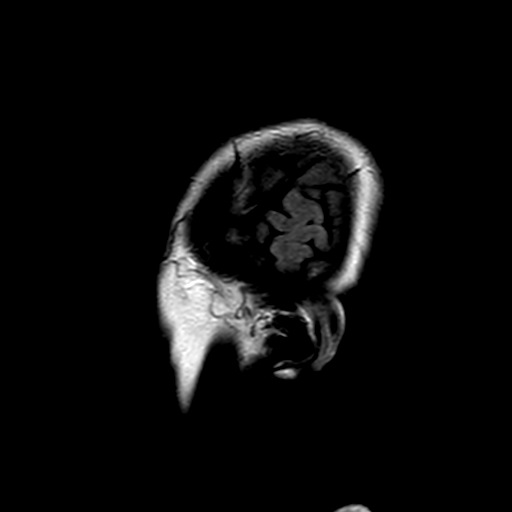
[im 20/40]
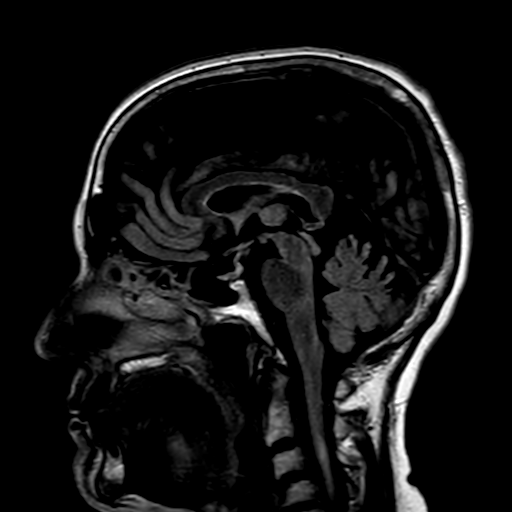
[im 40/40]
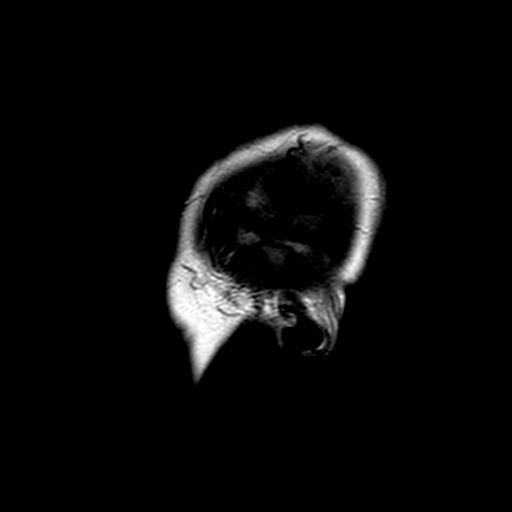

[Series 701: SWI · axial · 3.0mm · 0.40mm/px · z∈[-16,+99]mm · 9 of 200 slices shown]
[im 14/200]
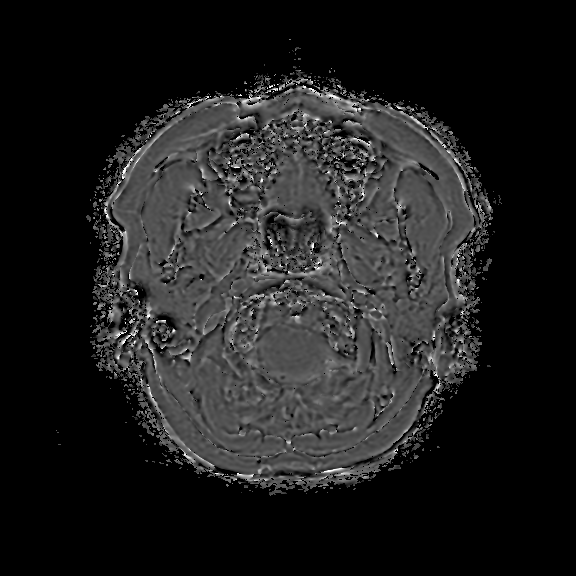
[im 27/200]
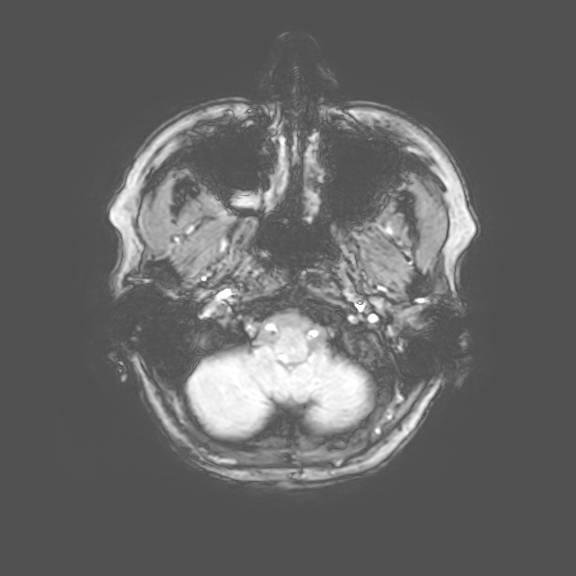
[im 40/200]
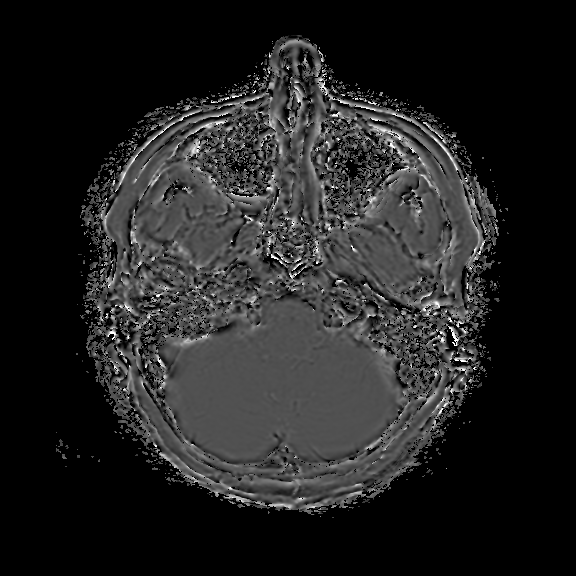
[im 67/200]
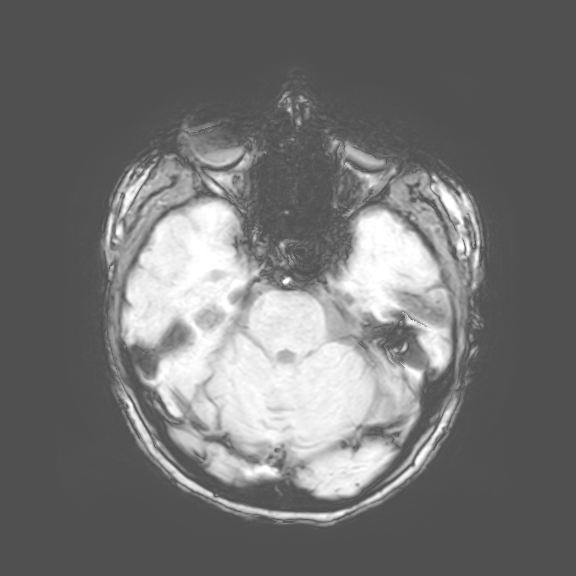
[im 93/200]
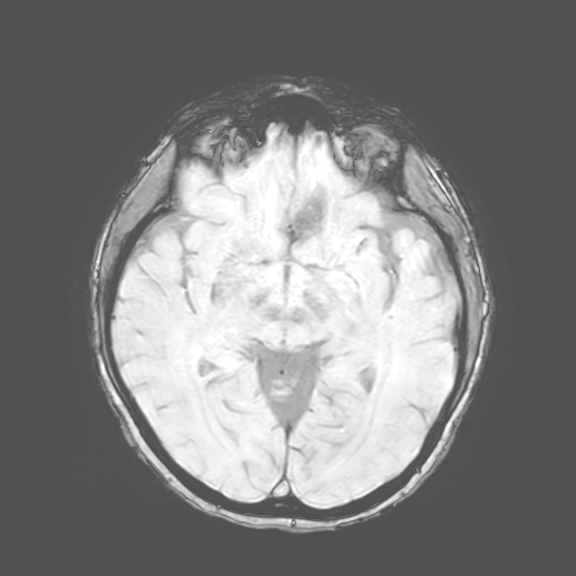
[im 107/200]
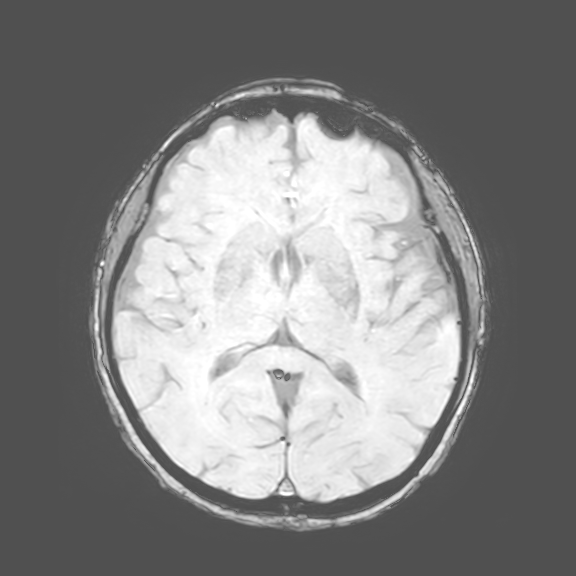
[im 120/200]
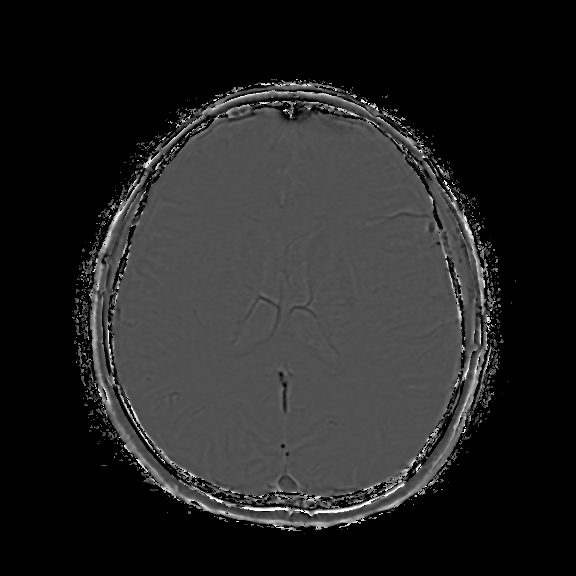
[im 146/200]
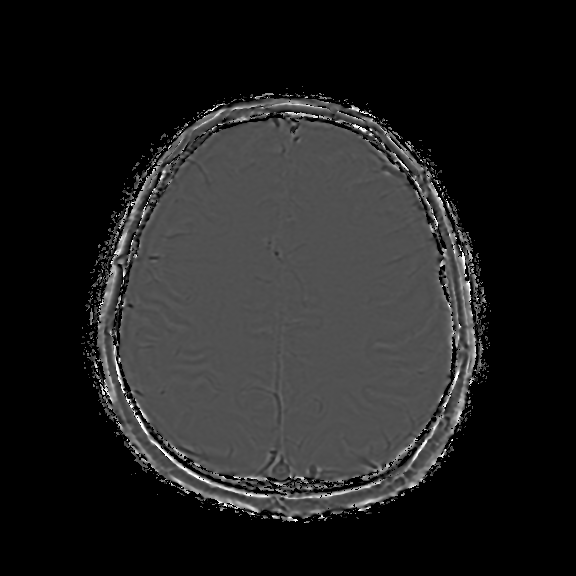
[im 173/200]
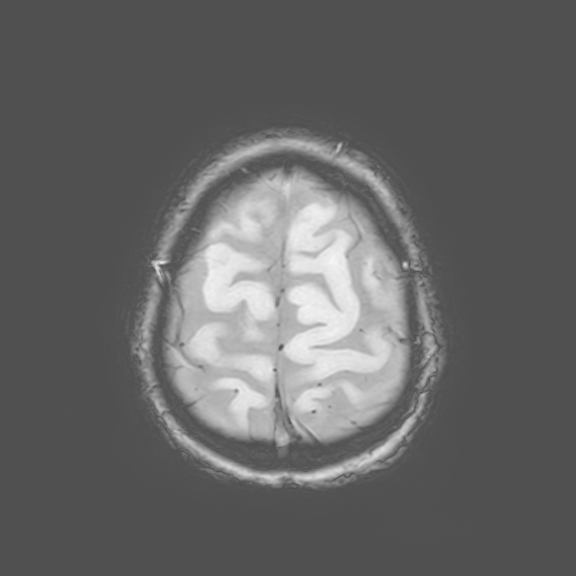

[17 of 48 positions shown; findings below may reference images not displayed]

FINDINGS: There is minimal nonfocal signal arising along the upper atrial and 
frontal horn margins, similar to the prior MRI. No discrete T2 hyperintense 
signal change evident. Sagittal FLAIR images show normal volume of the corpus 
callosum. The callososeptal interface is unremarkable minimal increased signal 
in the pontine tegmentum is nonspecific, best seen on axial T2 image 10, not 
different from the prior examination. No cerebellar lesion. Diffusion-weighted 
images are negative. Susceptibility sequences show no parenchymal hemosiderin. 
There is mild prominence of sulci along the upper cerebral convexities, 
unremarkable for age. No hydrocephalus. Craniocervical junction is open. There 
is cervical spondylosis. Disc-osteophyte at C3-4 encroaches on the ventral cord, 
contributing to canal stenosis, appearing similar to the prior MRI. 
Major intracranial arterial segments and dural sinuses are open. Allowing for 
motion artifact there is no evidence for orbital mass. Mild layering fluid in 
the right maxillary antrum is new since the prior study. Tympanic cavities and 
mastoid air cells are predominantly clear.
IMPRESSION: No focal T2 signal abnormality strongly indicative of demyelinating involvement. 
No evidence for diffusion restriction or mass.  
Mild layering fluid in the right maxillary antrum consistent with 
recent/resolving sinusitis.

## 2021-10-24 ENCOUNTER — Encounter: Admit: 2021-10-24 | Payer: PRIVATE HEALTH INSURANCE | Attending: Family Medicine | Primary: Family Medicine

## 2021-10-24 MED ORDER — DILTIAZEM CD 240 MG CAPSULE,EXTENDED RELEASE 24 HR
240 | ORAL_CAPSULE | ORAL | 4 refills | 90.00000 days | Status: AC
Start: 2021-10-24 — End: 2022-10-04

## 2022-01-31 IMAGING — CT CT SINUS WITHOUT CONTRAST
2 of 3 series · 14 of 37 positions shown, 17 images · non-contrast
Comparison: MRI brain from August 31, 2021.

________________________________________________________________________________________________ 
CT SINUS WITHOUT CONTRAST, 01/31/2022 [DATE]: 
CLINICAL INDICATION: Chronic postnasal drip. Sinus pain and pressure. Bilateral 
ear pain and pressure, worse on the left side. Pressure headaches.. 
A search for DICOM formatted images was conducted for prior CT imaging studies 
completed at a non-affiliated media free facility.
TECHNIQUE: The paranasal sinuses were scanned without contrast on a high 
resolution CT scanner using dose reduction techniques.  Routine MPR 
reconstructions were performed.

[Series 3: axial · axial · 0.33mm/px · z∈[-117,-21]mm · 11 of 161 slices shown, 14 images]
[im 12/161  brain]
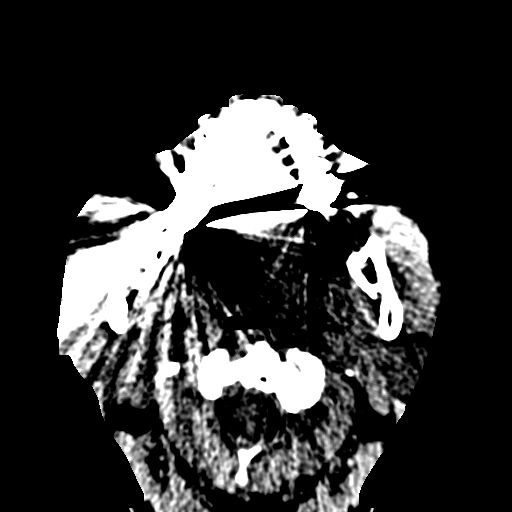
[im 12/161  bone]
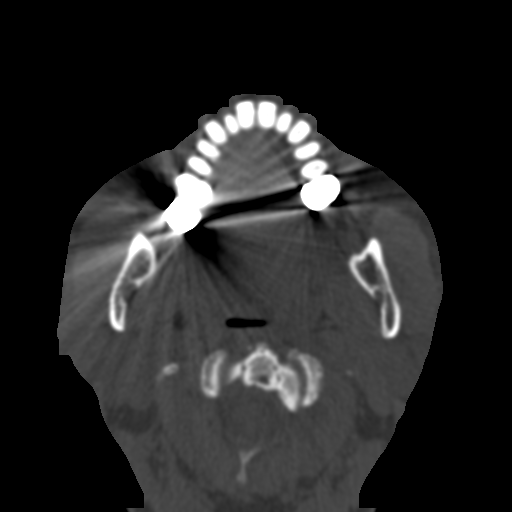
[im 23/161  bone]
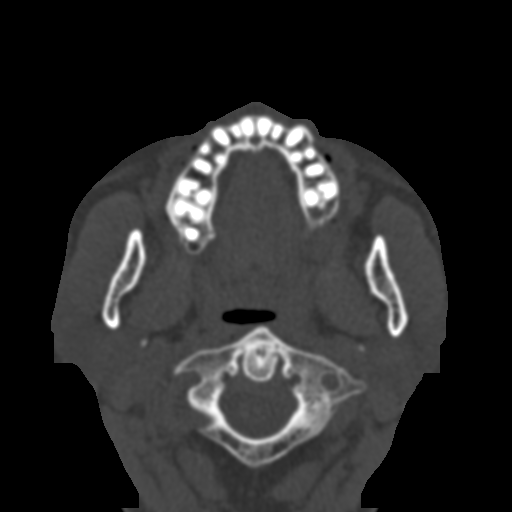
[im 35/161  bone]
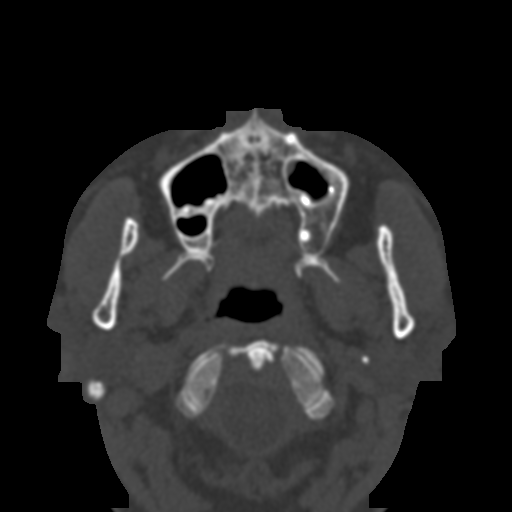
[im 58/161  bone]
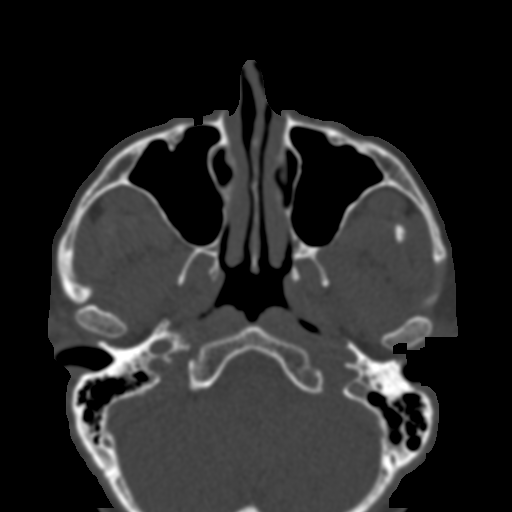
[im 69/161  brain]
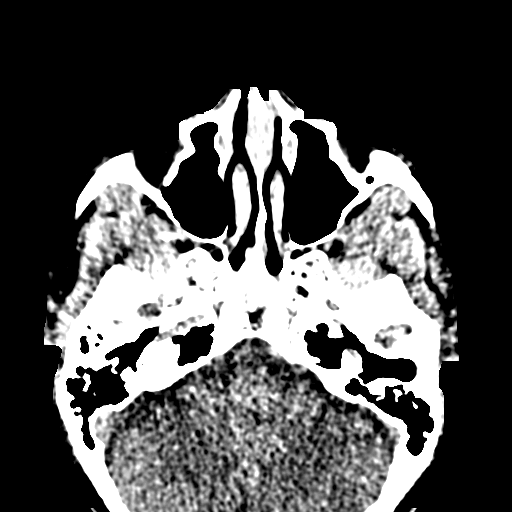
[im 69/161  bone]
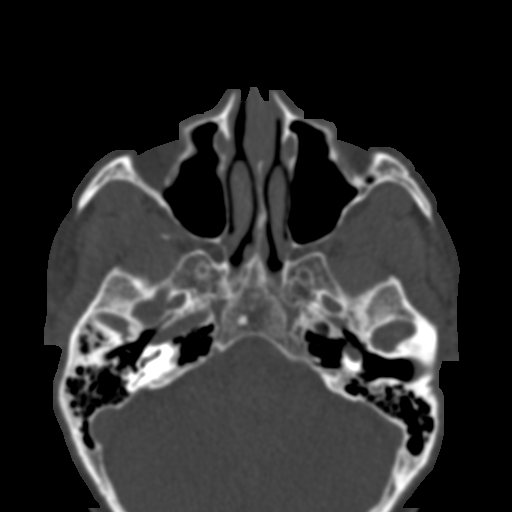
[im 81/161  bone]
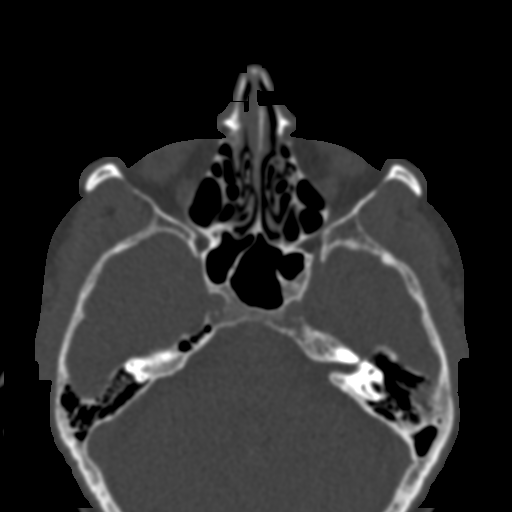
[im 92/161  bone]
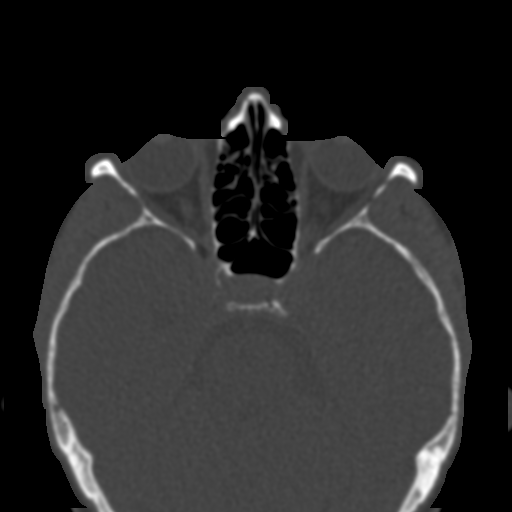
[im 103/161  bone]
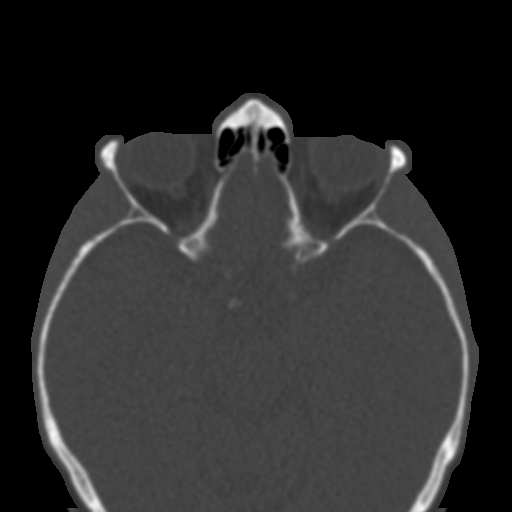
[im 126/161  brain]
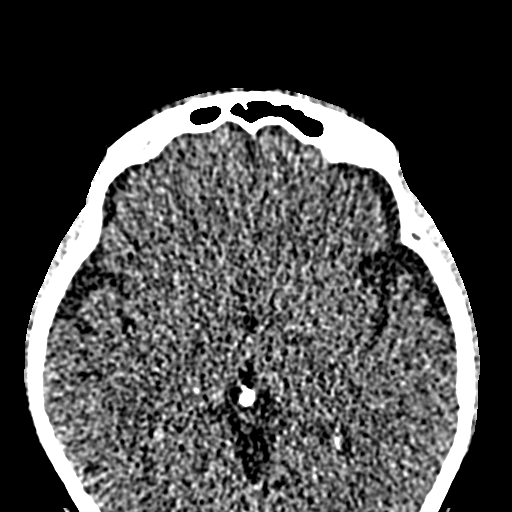
[im 126/161  bone]
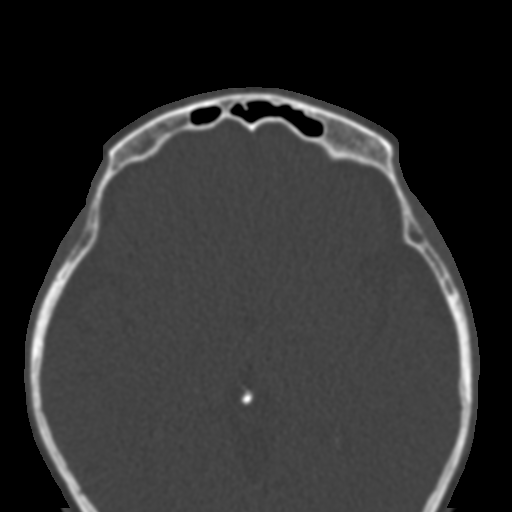
[im 138/161  bone]
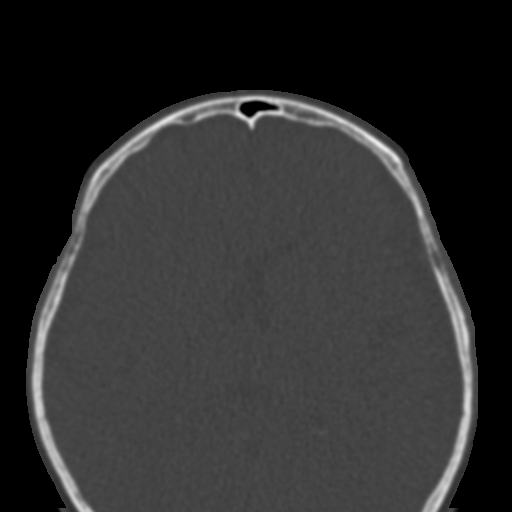
[im 149/161  bone]
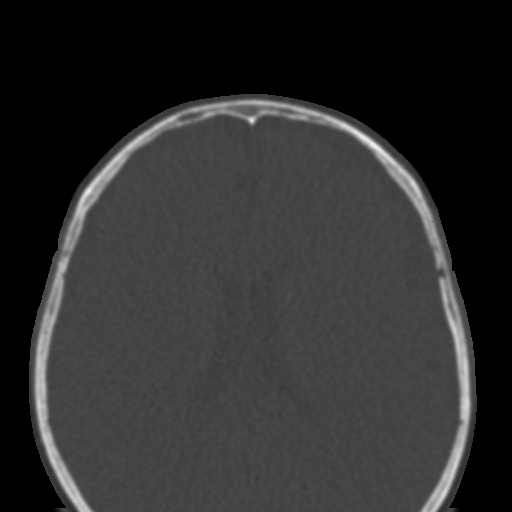

[Series 5: sag · sagittal · 0.25mm/px · 3 of 159 slices shown]
[im 53/159  bone]
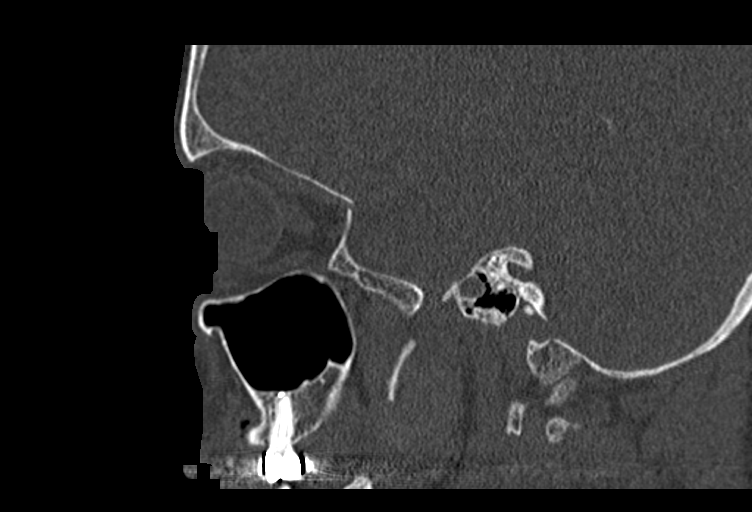
[im 80/159  bone]
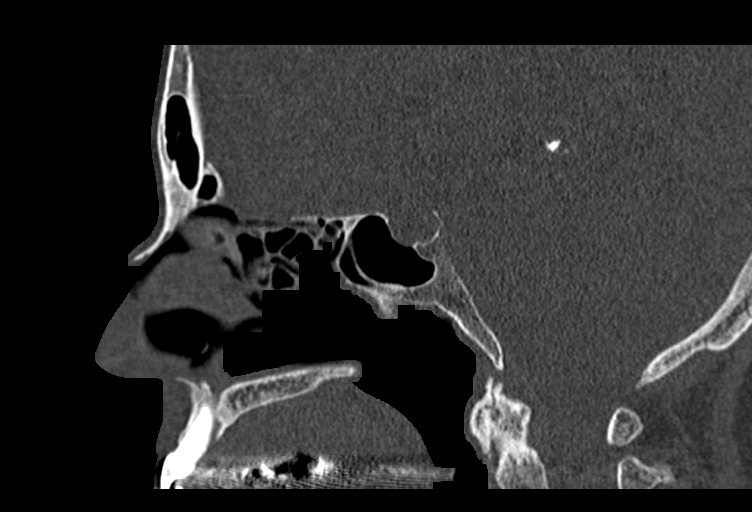
[im 106/159  bone]
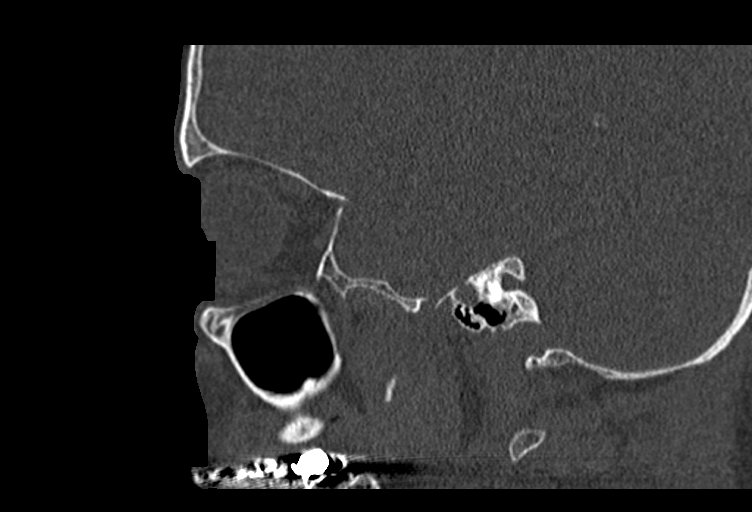

[14 of 37 positions shown; findings below may reference images not displayed]

FINDINGS: -------------------------------------------------------------------------------- 
------------------------- 
SINUSES: 
FRONTAL SINUSES: Clear.     
ETHMOID AIR CELLS: Clear.     
SPHENOID SINUSES: Clear.     
MAXILLARY SINUSES: Right maxillary sinus is clear. Very mild mucosal change 
along the floor of the left maxillary sinus with tiny retention cysts versus 
polyp inferior medially. There are changes of left root canal on tooth 14 with 
fractures along the root canal noted. Periapical lucency surrounding one of the 
roots which blends with the mild mucosal change of the left maxillary sinus. 
PARANASAL SINUS DRAINAGE PATHWAYS: Patency of the frontoethmoidal recesses. 
Patency of the ostiomeatal units. Patency of the sphenoethmoidal recesses. 
-------------------------------------------------------------------------------- 
----------------------- 
OTHER: 

NASAL CAVITY / SKULL BASE: Leftward deviation of nasal septum. Bilateral 
intralamellar air cells. Skull base is intact. Mastoid air cells and middle ear 
cavities are clear. 
NON-SINUS STRUCTURES: Bone island along the right clivus. Bilateral pontine 
tonsillar calcifications from the tonsil limits. Degenerative change at C1-C2 
articulation. Intracranial vascular calcification. 

-------------------------------------------------------------------------------- 
---------------------
IMPRESSION: 1.  Very mild mucosal change along the floor of left maxillary sinus which does 
blend with periapical lucency along roots of tooth 14. This tooth has changes of 
root canal with fractures along the root canal. 
2.  Remainder of the paranasal sinuses are clear. Mastoid air cells and middle 
ear cavities are clear. 
RADIATION DOSE REDUCTION: All CT scans are performed using radiation dose 
reduction techniques, when applicable.  Technical factors are evaluated and 
adjusted to ensure appropriate moderation of exposure.  Automated dose 
management technology is applied to adjust the radiation doses to minimize 
exposure while achieving diagnostic quality images.

## 2022-03-21 ENCOUNTER — Encounter: Admit: 2022-03-21 | Payer: PRIVATE HEALTH INSURANCE | Primary: Family Medicine

## 2022-03-21 DIAGNOSIS — R197 Diarrhea, unspecified: Secondary | ICD-10-CM

## 2022-03-21 DIAGNOSIS — K219 Gastro-esophageal reflux disease without esophagitis: Secondary | ICD-10-CM

## 2022-03-21 DIAGNOSIS — R140 Abdominal distension (gaseous): Secondary | ICD-10-CM

## 2022-03-29 ENCOUNTER — Encounter: Admit: 2022-03-29 | Payer: PRIVATE HEALTH INSURANCE | Attending: Family Medicine | Primary: Family Medicine

## 2022-03-29 ENCOUNTER — Encounter: Admit: 2022-03-29 | Payer: Managed Care (Private) | Attending: Family Medicine | Primary: Family Medicine

## 2022-03-29 ENCOUNTER — Ambulatory Visit: Admit: 2022-03-29 | Payer: PRIVATE HEALTH INSURANCE | Attending: Family Medicine | Primary: Family Medicine

## 2022-03-29 DIAGNOSIS — R1084 Generalized abdominal pain: Secondary | ICD-10-CM

## 2022-03-29 DIAGNOSIS — K5900 Constipation, unspecified: Secondary | ICD-10-CM

## 2022-03-29 DIAGNOSIS — R634 Abnormal weight loss: Secondary | ICD-10-CM

## 2022-03-29 DIAGNOSIS — K591 Functional diarrhea: Secondary | ICD-10-CM

## 2022-03-29 MED ORDER — ALBUTEROL SULFATE HFA 90 MCG/ACTUATION AEROSOL INHALER
90 | RESPIRATORY_TRACT | 2.00 refills | 25.00000 days | Status: AC
Start: 2022-03-29 — End: ?

## 2022-03-29 MED ORDER — ESOMEPRAZOLE MAGNESIUM 40 MG CAPSULE,DELAYED RELEASE
40 | ORAL_CAPSULE | Freq: Every morning | ORAL | 12 refills | 90.00000 days | Status: AC
Start: 2022-03-29 — End: ?

## 2022-04-03 ENCOUNTER — Inpatient Hospital Stay: Admit: 2022-04-03 | Discharge: 2022-04-03 | Payer: PRIVATE HEALTH INSURANCE | Primary: Family Medicine

## 2022-04-03 ENCOUNTER — Encounter: Admit: 2022-04-03 | Payer: PRIVATE HEALTH INSURANCE | Attending: Family Medicine | Primary: Family Medicine

## 2022-04-06 ENCOUNTER — Encounter: Admit: 2022-04-06 | Payer: PRIVATE HEALTH INSURANCE | Primary: Family Medicine

## 2022-04-06 ENCOUNTER — Inpatient Hospital Stay: Admit: 2022-04-06 | Discharge: 2022-04-06 | Payer: PRIVATE HEALTH INSURANCE | Primary: Family Medicine

## 2022-04-06 ENCOUNTER — Encounter: Admit: 2022-04-06 | Payer: PRIVATE HEALTH INSURANCE | Attending: Family Medicine | Primary: Family Medicine

## 2022-04-06 DIAGNOSIS — K591 Functional diarrhea: Secondary | ICD-10-CM

## 2022-04-06 DIAGNOSIS — R102 Pelvic and perineal pain: Secondary | ICD-10-CM

## 2022-04-06 DIAGNOSIS — R634 Abnormal weight loss: Secondary | ICD-10-CM

## 2022-04-06 DIAGNOSIS — R1084 Generalized abdominal pain: Secondary | ICD-10-CM

## 2022-04-06 DIAGNOSIS — N839 Noninflammatory disorder of ovary, fallopian tube and broad ligament, unspecified: Secondary | ICD-10-CM

## 2022-04-06 DIAGNOSIS — K5900 Constipation, unspecified: Secondary | ICD-10-CM

## 2022-04-06 MED ORDER — IOHEXOL 240 MG IODINE/ML INTRAVENOUS SOLUTION
240 mg iodine/mL | Freq: Once | Status: CP | PRN
Start: 2022-04-06 — End: ?
  Administered 2022-04-06: 14:00:00 240 mL

## 2022-04-06 MED ORDER — SODIUM CHLORIDE 0.9 % BOLUS (NEW BAG)
0.9 % | Freq: Once | INTRAVENOUS | Status: CP
Start: 2022-04-06 — End: ?
  Administered 2022-04-06: 14:00:00 0.9 mL/h via INTRAVENOUS

## 2022-04-06 MED ORDER — IOHEXOL 350 MG IODINE/ML INTRAVENOUS SOLUTION
350 mg iodine/mL | Freq: Once | INTRAVENOUS | Status: CP | PRN
Start: 2022-04-06 — End: ?
  Administered 2022-04-06: 14:00:00 350 mL via INTRAVENOUS

## 2022-04-06 NOTE — Other
Possible ovarian cyst on the R and venous congestion on the L suggesting pelvic congestion syndrome.  It would be wise to see her gynecologist. Also some diverticulosis w/o diverticulitis.  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 Liver, spleen, pancreas and gallbladder all normal.

## 2022-04-06 NOTE — Other
Patient aware of resultsShe would like you to make a referral to a GYN.She states that she used to see Dr Glynn Octave and he is NOT in practice anymore.  She doesn't want to pick a random GYN doc.

## 2022-04-11 ENCOUNTER — Encounter: Admit: 2022-04-11 | Payer: PRIVATE HEALTH INSURANCE | Attending: Gynecology | Primary: Family Medicine

## 2022-04-11 DIAGNOSIS — R1900 Intra-abdominal and pelvic swelling, mass and lump, unspecified site: Secondary | ICD-10-CM

## 2022-04-19 ENCOUNTER — Inpatient Hospital Stay: Admit: 2022-04-19 | Discharge: 2022-04-19 | Payer: PRIVATE HEALTH INSURANCE | Primary: Family Medicine

## 2022-04-19 ENCOUNTER — Encounter: Admit: 2022-04-19 | Payer: PRIVATE HEALTH INSURANCE | Attending: Gynecology | Primary: Family Medicine

## 2022-04-19 DIAGNOSIS — R1900 Intra-abdominal and pelvic swelling, mass and lump, unspecified site: Secondary | ICD-10-CM

## 2022-04-19 DIAGNOSIS — N83201 Unspecified ovarian cyst, right side: Secondary | ICD-10-CM

## 2022-04-21 ENCOUNTER — Encounter: Admit: 2022-04-21 | Payer: PRIVATE HEALTH INSURANCE | Attending: Family Medicine | Primary: Family Medicine

## 2022-05-03 ENCOUNTER — Encounter: Admit: 2022-05-03 | Payer: PRIVATE HEALTH INSURANCE | Primary: Family Medicine

## 2022-06-01 ENCOUNTER — Encounter: Admit: 2022-06-01 | Payer: PRIVATE HEALTH INSURANCE | Primary: Family Medicine

## 2022-06-01 DIAGNOSIS — N83209 Unspecified ovarian cyst, unspecified side: Secondary | ICD-10-CM

## 2022-07-25 ENCOUNTER — Inpatient Hospital Stay: Admit: 2022-07-25 | Discharge: 2022-07-25 | Payer: PRIVATE HEALTH INSURANCE | Primary: Family Medicine

## 2022-07-25 DIAGNOSIS — N83209 Unspecified ovarian cyst, unspecified side: Secondary | ICD-10-CM

## 2022-07-27 ENCOUNTER — Encounter: Admit: 2022-07-27 | Payer: PRIVATE HEALTH INSURANCE | Primary: Family Medicine

## 2022-07-27 DIAGNOSIS — N83209 Unspecified ovarian cyst, unspecified side: Secondary | ICD-10-CM

## 2022-08-04 ENCOUNTER — Encounter: Admit: 2022-08-04 | Payer: PRIVATE HEALTH INSURANCE | Primary: Family Medicine

## 2022-08-09 ENCOUNTER — Telehealth: Admit: 2022-08-09 | Payer: PRIVATE HEALTH INSURANCE | Attending: Family Medicine | Primary: Family Medicine

## 2022-08-28 ENCOUNTER — Encounter: Admit: 2022-08-28 | Payer: PRIVATE HEALTH INSURANCE | Primary: Family Medicine

## 2022-09-13 IMAGING — MR MRI BRAIN WITHOUT CONTRAST
8 of 10 series · 20 of 48 positions shown · IV contrast (gadolinium)
Comparison: Brain and cervical spine MRI August 31, 2021, brain and cervical 
spine MRI November 03, 2019.

________________________________________________________________________________________________ 
MRI BRAIN WITHOUT CONTRAST, 09/13/2022 [DATE]: 
CLINICAL INDICATION: Multiple sclerosis. No new complaints.
TECHNIQUE: Multiplanar, multiecho position MR images of the brain were performed 
without intravenous gadolinium enhancement. Patient was scanned on a
magnet.

[Series 102: mpr - smartbrain · axial · 1.1mm · 1.09mm/px · 1 of 2 slices shown]
[im 1/2]
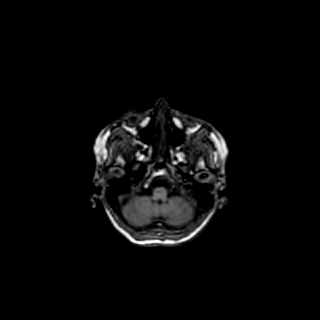

[Series 203: dadc map · axial · 5.0mm · 1.03mm/px · 1 of 27 slices shown]
[im 1/27]
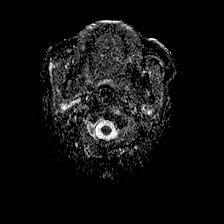

[Series 204: (id) · axial · 5.0mm · 1.03mm/px · z∈[-61,+92]mm · 2 of 27 slices shown]
[im 1/27]
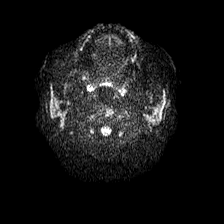
[im 27/27]
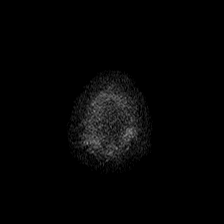

[Series 301: t1_se_sag · sagittal · 4.0mm · 0.43mm/px · 2 of 28 slices shown]
[im 1/28]
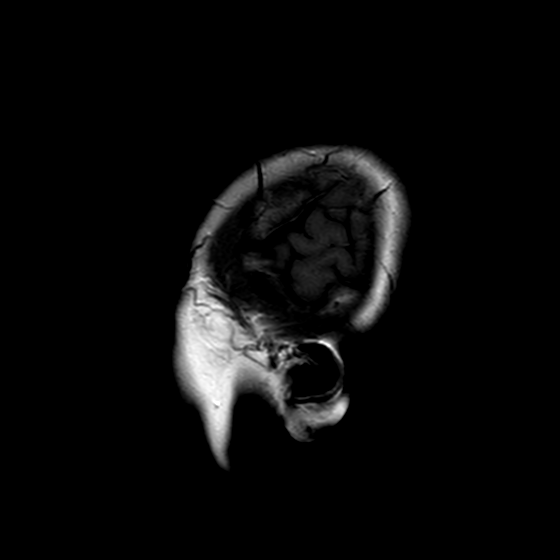
[im 28/28]
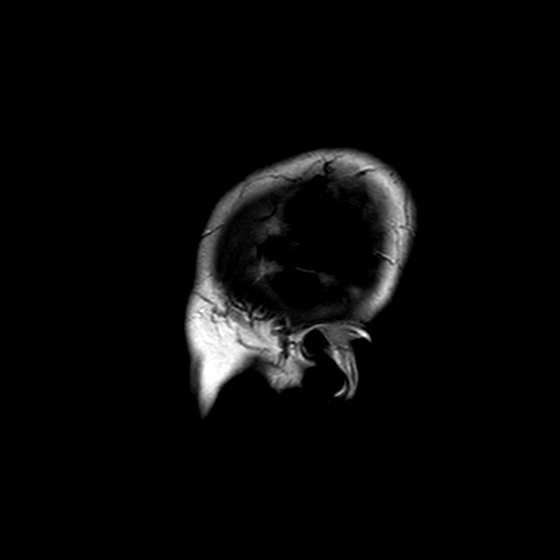

[Series 501: flair_ax+fs · axial · 5.0mm · 0.49mm/px · z∈[-58,+94]mm · 2 of 27 slices shown]
[im 1/27]
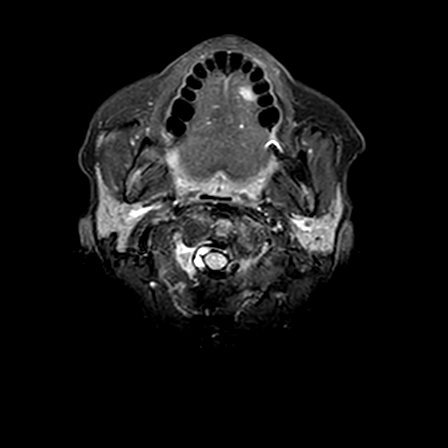
[im 27/27]
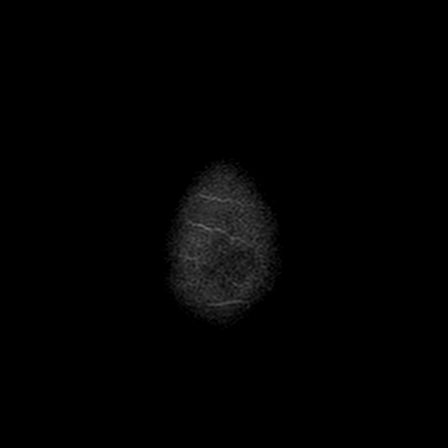

[Series 601: t1w_se_ax · axial · 5.0mm · 0.43mm/px · z∈[-58,+94]mm · 2 of 27 slices shown]
[im 1/27]
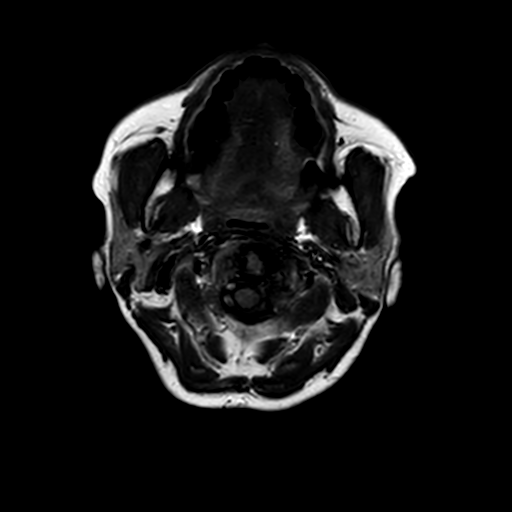
[im 27/27]
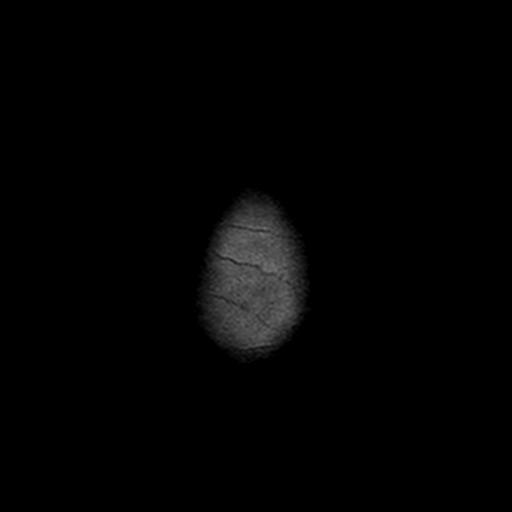

[Series 702: swip · axial · 10.0mm · 0.36mm/px · z∈[-42,+93]mm · 8 of 140 slices shown]
[im 1/140]
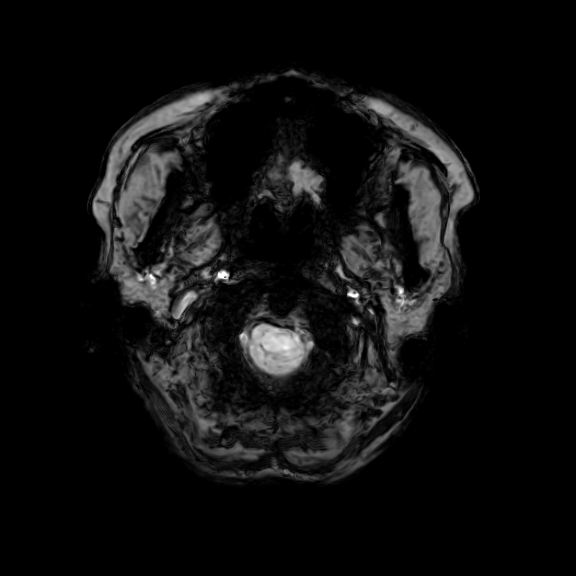
[im 28/140]
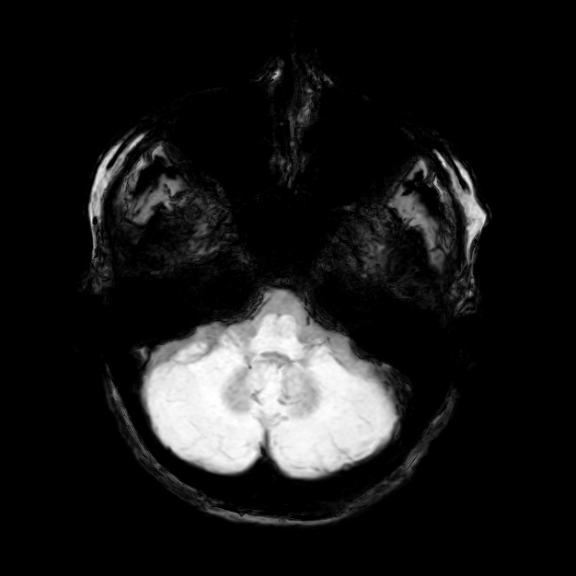
[im 42/140]
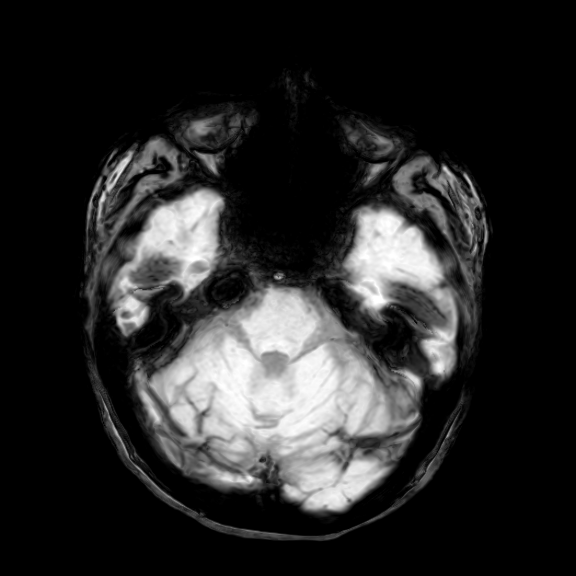
[im 56/140]
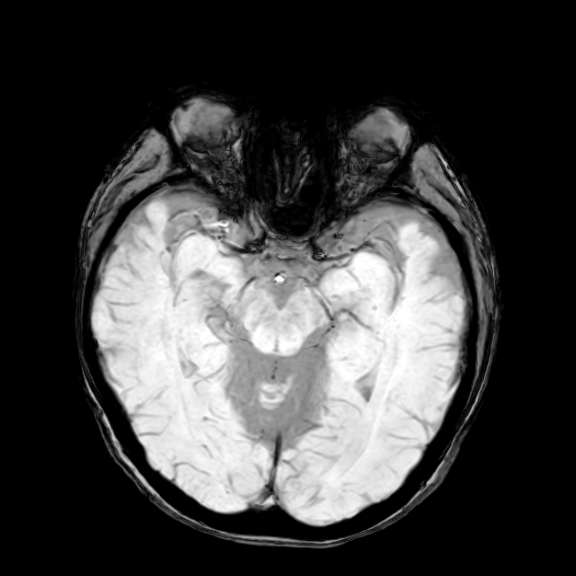
[im 84/140]
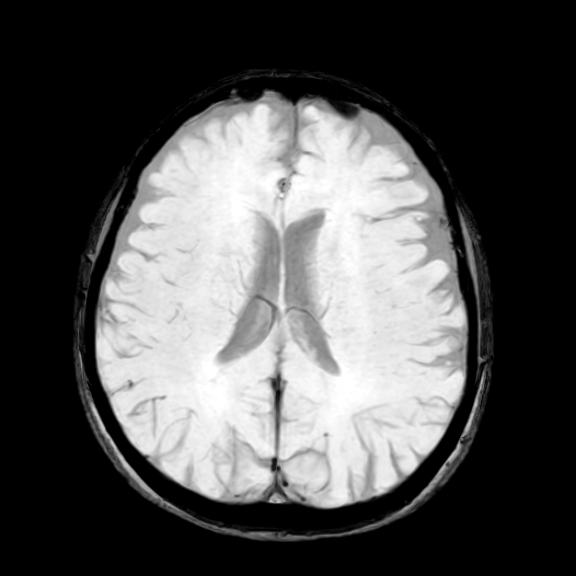
[im 98/140]
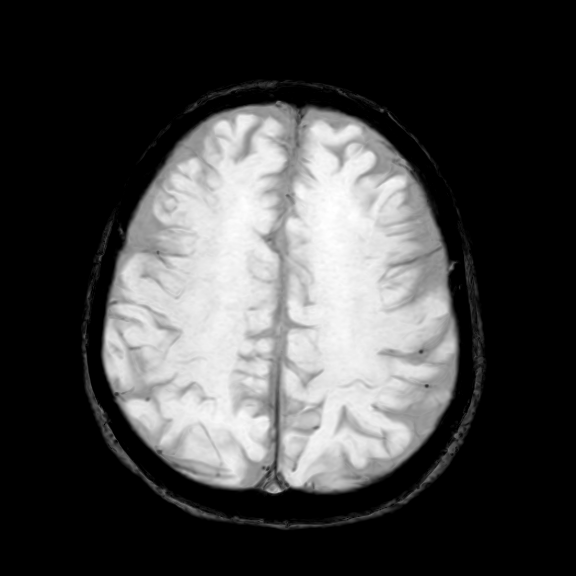
[im 112/140]
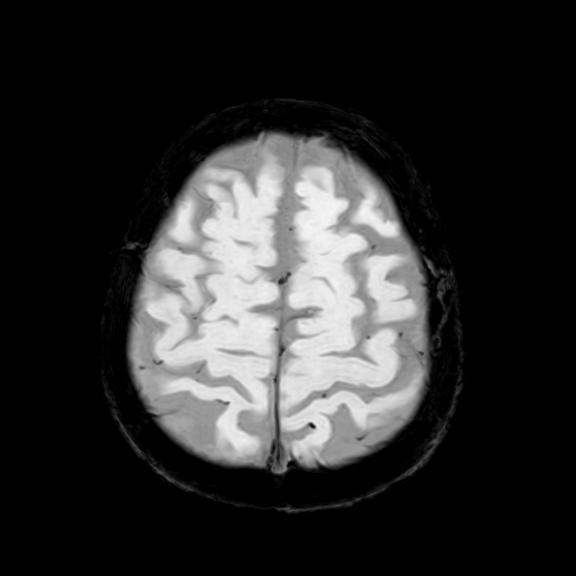
[im 140/140]
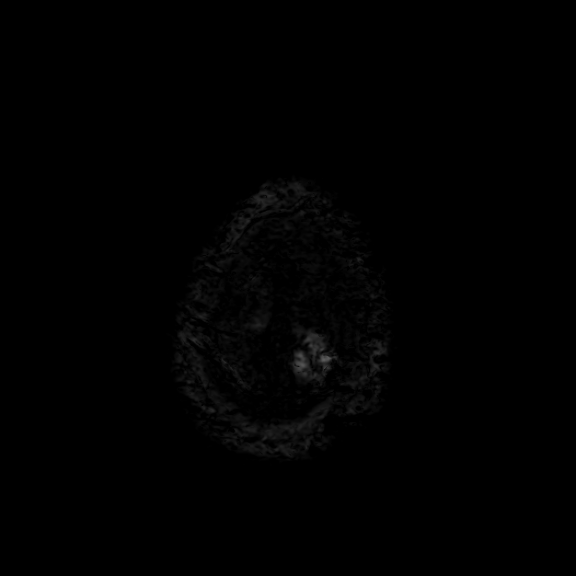

[Series 801: t2_ax · axial · 5.0mm · 0.43mm/px · z∈[-58,+94]mm · 2 of 27 slices shown]
[im 1/27]
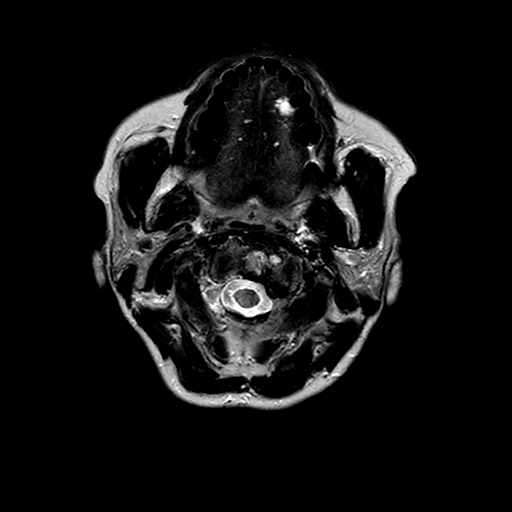
[im 27/27]
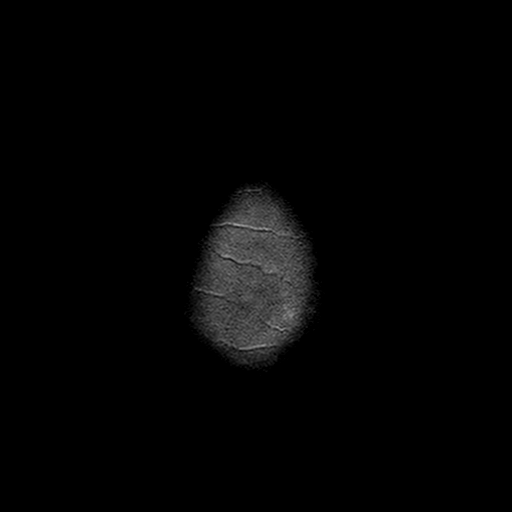

[20 of 48 positions shown; findings below may reference images not displayed]

FINDINGS: -------------------------------------------------------------------------------- 
------------------------- 
INTRACRANIAL: 
Corpus callosum parenchymal volume is preserved. There is stable, minimal 
nonspecific periventricular T2 FLAIR signal, specifically adjacent to the 
frontal horns and atria of the lateral ventricles. The callosal septal interface 
remains unremarkable. No mass or abnormal extra-axial fluid collection. No other 
abnormal brain parenchymal signal. 
No acute ischemia. No abnormal foci of susceptibility artifact in the brain. 
Patency of the intracranial vascular flow voids.  No acute intracranial 
hemorrhage, mass effect, midline shift. No large sellar mass. No hydrocephalus. 
Cerebral volume is age appropriate.  
-------------------------------------------------------------------------------- 
----------------------- 
OTHER: 
ORBITS/SINUSES/T-BONES:  Visualized orbits show no acute abnormality or mass.  
Mastoid air cells and middle ear cavities are grossly clear.  Visualized 
paranasal sinuses are clear. 
MARROW SIGNAL/SOFT TISSUES: No focal suspect signal abnormality.  
-------------------------------------------------------------------------------- 
-------------------
IMPRESSION: Stable brain MRI. Minimal nonspecific periventricular T2 FLAIR signal is stable. 
There are no specific findings that suggest demyelination.

## 2022-10-04 ENCOUNTER — Encounter: Admit: 2022-10-04 | Payer: PRIVATE HEALTH INSURANCE | Attending: Family Medicine | Primary: Family Medicine

## 2022-10-04 MED ORDER — DILTIAZEM CD 240 MG CAPSULE,EXTENDED RELEASE 24 HR
240 | ORAL_CAPSULE | ORAL | 1 refills | 90.00000 days | Status: AC
Start: 2022-10-04 — End: 2023-01-04

## 2022-10-25 ENCOUNTER — Encounter: Admit: 2022-10-25 | Payer: PRIVATE HEALTH INSURANCE | Primary: Family Medicine

## 2023-01-04 ENCOUNTER — Encounter: Admit: 2023-01-04 | Payer: PRIVATE HEALTH INSURANCE | Attending: Family Medicine | Primary: Family Medicine

## 2023-01-04 MED ORDER — DILTIAZEM CD 240 MG CAPSULE,EXTENDED RELEASE 24 HR
240 | ORAL_CAPSULE | ORAL | 1 refills | 90.00000 days | Status: AC
Start: 2023-01-04 — End: 2023-03-28

## 2023-01-09 IMAGING — MG MAMMOGRAPHY SCREENING BILATERAL 3[PERSON_NAME]
8 series · 8 of 24 positions shown · non-contrast
Comparison: 08/31/2021 and dating back to 10/10/2003

________________________________________________________________________________________________ 
MAMMOGRAPHY SCREENING BILATERAL [id number] [id number], 01/09/2023 [DATE]: 
CLINICAL INDICATION: Encounter for screening mammogram.
TECHNIQUE: Digital bilateral mammograms and 3-D Tomosynthesis were obtained. 
These were interpreted both primarily and with the aid of computer-aided 
detection system.  
BREAST DENSITY: (Level C) The breasts are heterogeneously dense, which may 
obscure small masses.

[R MLO]
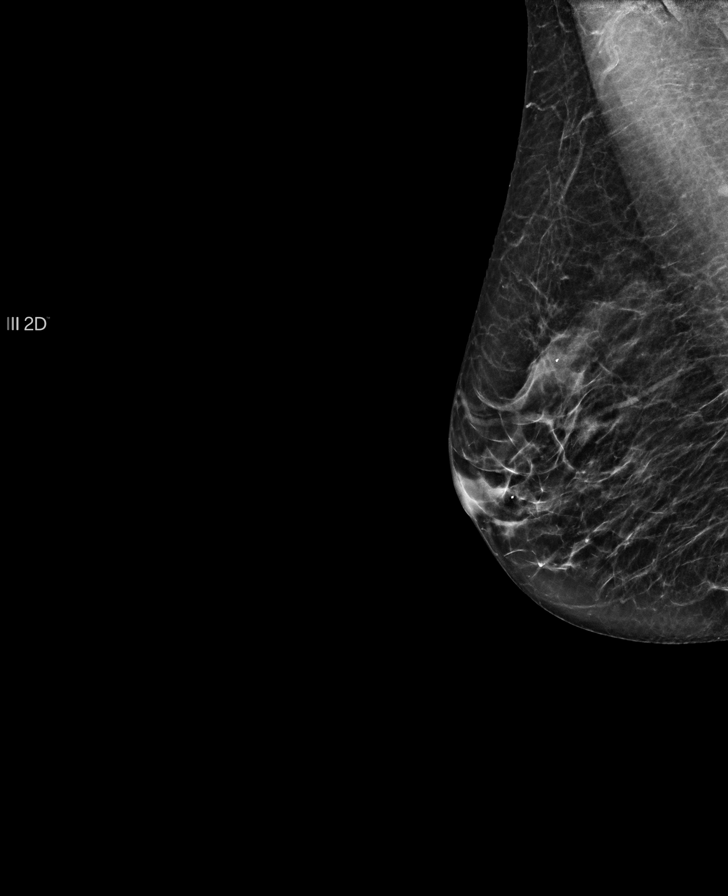

[R CC]
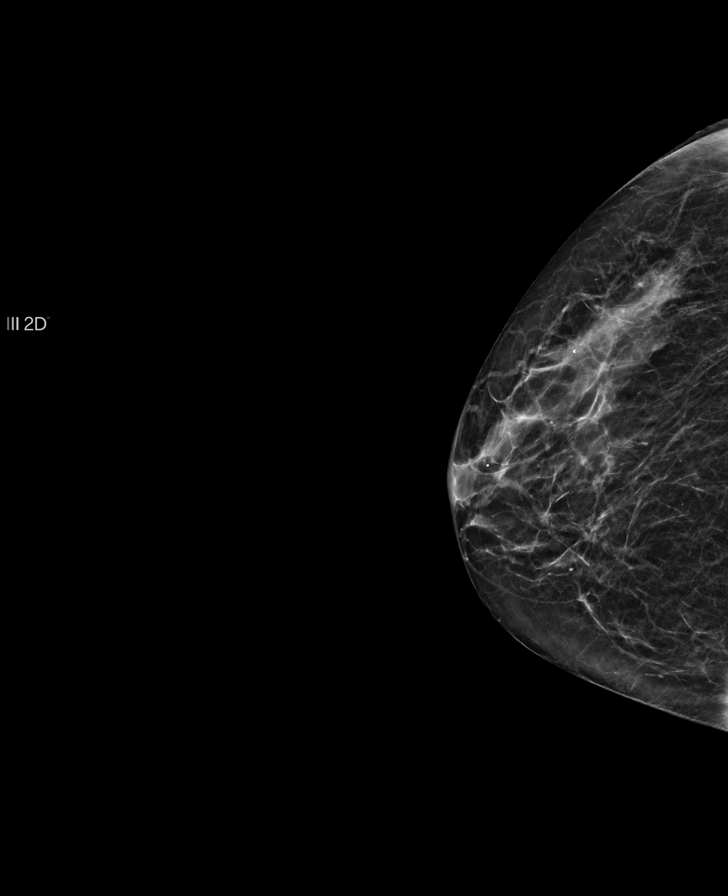

[L MLO]
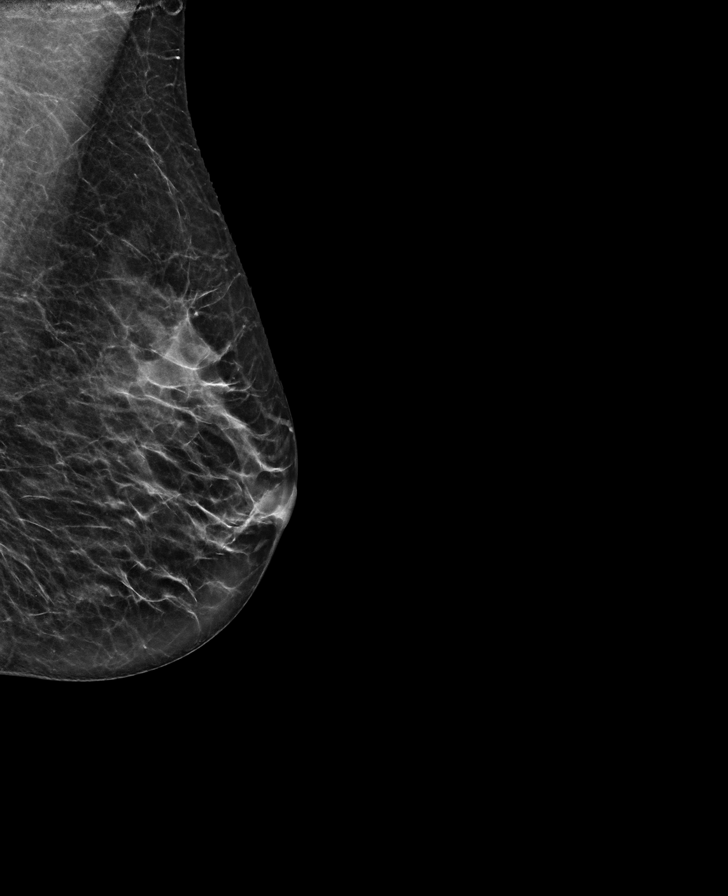

[L CC]
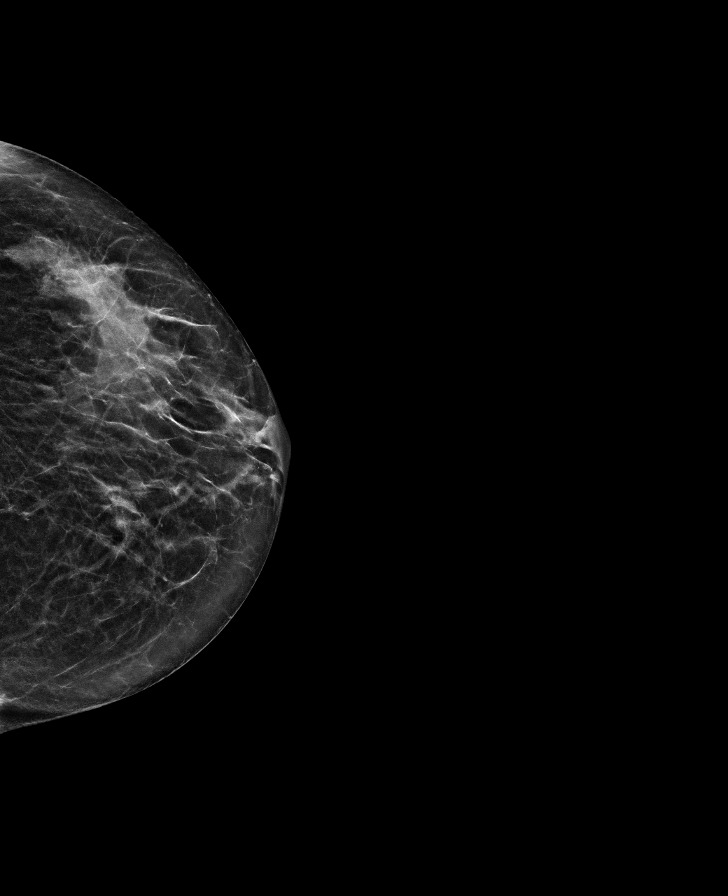

[L CC tomo · tomo slice 23/45.0]
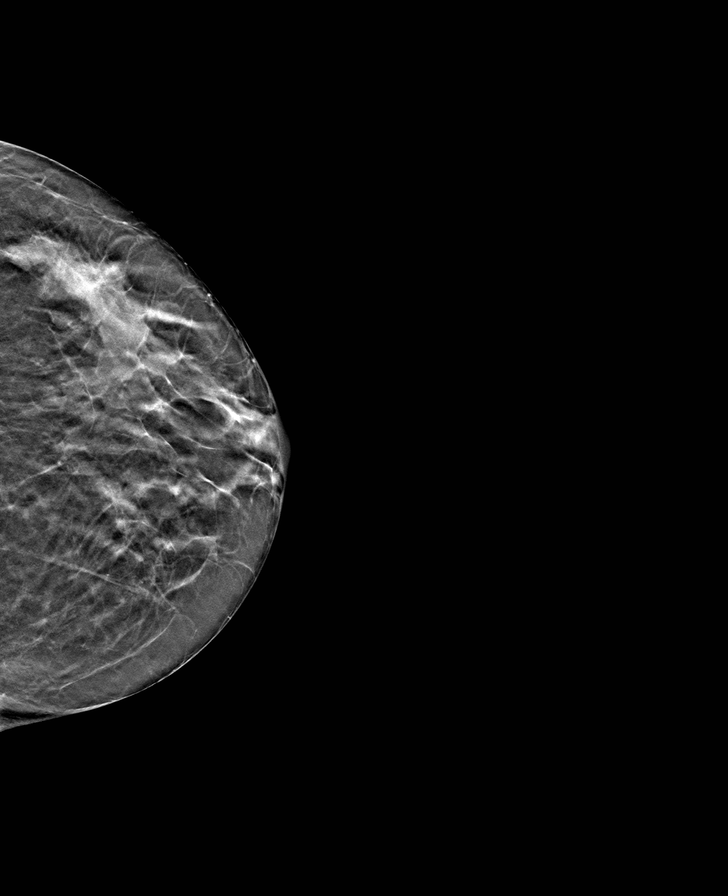

[L MLO tomo · tomo slice 23/45.0]
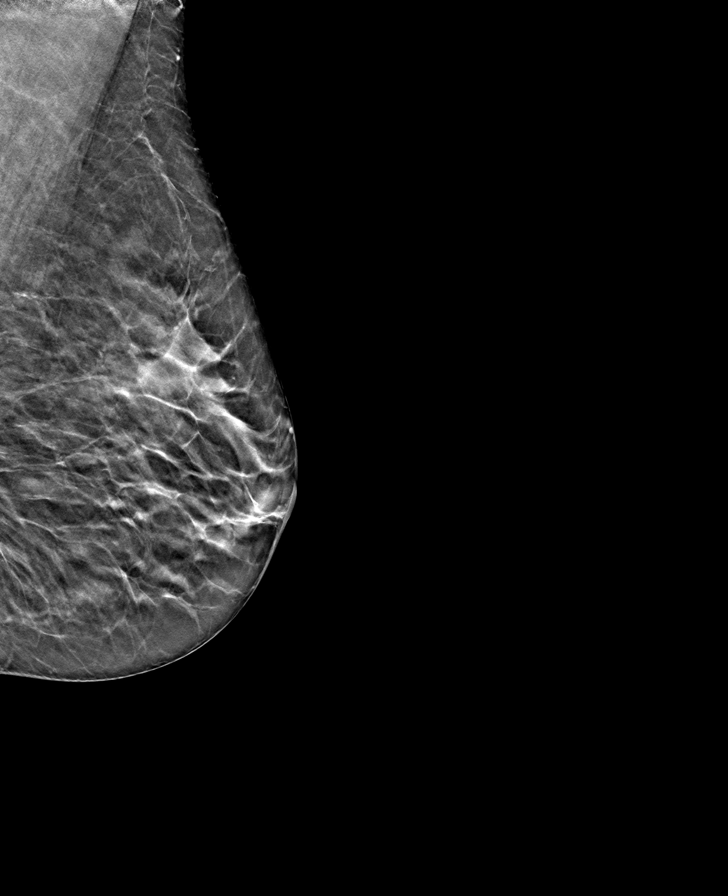

[R CC tomo · tomo slice 23/46.0]
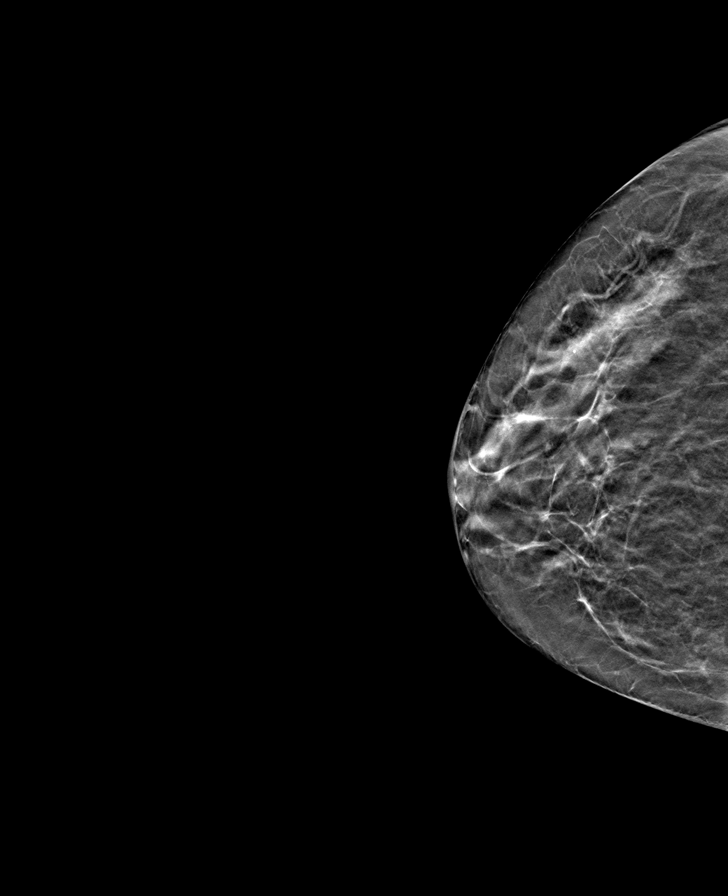

[R MLO tomo · tomo slice 23/45.0]
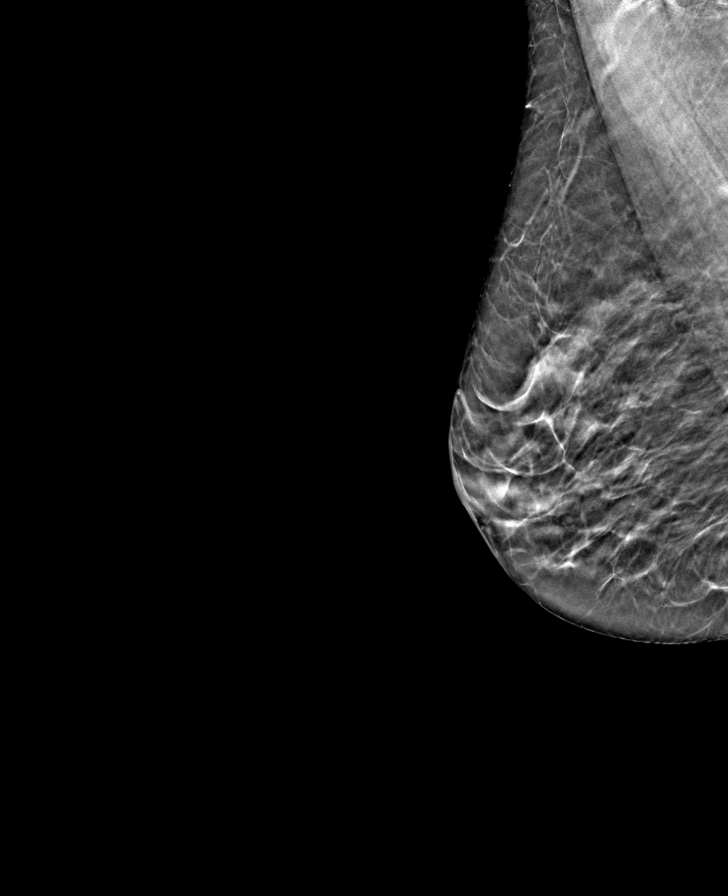

[8 of 24 positions shown; findings below may reference images not displayed]

FINDINGS: No suspicious mass, calcifications, or area of architectural 
distortion in either breast. Overall stable mammographic appearance.
IMPRESSION: No mammographic findings suggestive for malignancy. 
(BI-RADS 2) Benign findings. Routine mammographic follow-up is recommended.

## 2023-02-14 ENCOUNTER — Ambulatory Visit: Admit: 2023-02-14 | Payer: PRIVATE HEALTH INSURANCE | Primary: Family Medicine

## 2023-02-16 ENCOUNTER — Ambulatory Visit: Admit: 2023-02-16 | Payer: PRIVATE HEALTH INSURANCE | Primary: Family Medicine

## 2023-03-28 ENCOUNTER — Encounter: Admit: 2023-03-28 | Payer: PRIVATE HEALTH INSURANCE | Attending: Family Medicine | Primary: Family Medicine

## 2023-03-28 MED ORDER — DILTIAZEM CD 240 MG CAPSULE,EXTENDED RELEASE 24 HR
240 | ORAL_CAPSULE | ORAL | 1 refills | 90.00000 days | Status: AC
Start: 2023-03-28 — End: 2023-06-28

## 2023-04-03 ENCOUNTER — Ambulatory Visit: Admit: 2023-04-03 | Payer: PRIVATE HEALTH INSURANCE | Primary: Family Medicine

## 2023-04-12 ENCOUNTER — Ambulatory Visit: Admit: 2023-04-12 | Payer: Managed Care (Private) | Primary: Family Medicine

## 2023-04-12 ENCOUNTER — Ambulatory Visit: Admit: 2023-04-12 | Payer: PRIVATE HEALTH INSURANCE | Attending: Family Medicine | Primary: Family Medicine

## 2023-04-12 ENCOUNTER — Telehealth: Admit: 2023-04-12 | Payer: PRIVATE HEALTH INSURANCE | Attending: Family Medicine | Primary: Family Medicine

## 2023-04-12 DIAGNOSIS — R399 Unspecified symptoms and signs involving the genitourinary system: Secondary | ICD-10-CM

## 2023-04-17 ENCOUNTER — Inpatient Hospital Stay: Admit: 2023-04-17 | Discharge: 2023-04-17 | Payer: PRIVATE HEALTH INSURANCE | Primary: Family Medicine

## 2023-04-17 DIAGNOSIS — N83209 Unspecified ovarian cyst, unspecified side: Secondary | ICD-10-CM

## 2023-05-31 ENCOUNTER — Encounter: Admit: 2023-05-31 | Payer: PRIVATE HEALTH INSURANCE | Primary: Family Medicine

## 2023-05-31 DIAGNOSIS — N83209 Unspecified ovarian cyst, unspecified side: Secondary | ICD-10-CM

## 2023-06-28 ENCOUNTER — Encounter: Admit: 2023-06-28 | Payer: PRIVATE HEALTH INSURANCE | Attending: Family Medicine | Primary: Family Medicine

## 2023-06-28 MED ORDER — DILTIAZEM CD 240 MG CAPSULE,EXTENDED RELEASE 24 HR
240 | ORAL_CAPSULE | ORAL | 1 refills | 90.00000 days | Status: AC
Start: 2023-06-28 — End: 2023-09-17

## 2023-09-17 ENCOUNTER — Encounter: Admit: 2023-09-17 | Payer: PRIVATE HEALTH INSURANCE | Attending: Family Medicine | Primary: Family Medicine

## 2023-09-17 MED ORDER — DILTIAZEM CD 240 MG CAPSULE,EXTENDED RELEASE 24 HR
240 | ORAL_CAPSULE | ORAL | 1 refills | 90.00000 days | Status: AC
Start: 2023-09-17 — End: 2023-12-24

## 2023-10-15 IMAGING — MR MRI BRAIN WITHOUT CONTRAST
9 of 13 series · 22 of 48 positions shown · IV contrast (gadolinium)
Comparison: MRI brain from September 13, 2022. Additional MRI scans dating back 
to November 03, 2019.

________________________________________________________________________________________________ 
MRI BRAIN WITHOUT CONTRAST, 10/15/2023 [DATE]: 
CLINICAL INDICATION: Multiple sclerosis
TECHNIQUE: Multiplanar, multiecho position MR images of the brain were performed 
without intravenous gadolinium enhancement. Patient was scanned on a
magnet.

[Series 102: mpr - smartbrain · axial · 1.1mm · 1.09mm/px · 1 of 2 slices shown]
[im 1/2]
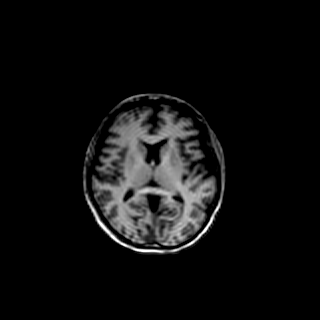

[Series 103: patient aligned mpr · axial · 21.9mm · 1.09mm/px · z∈[-212,+186]mm · 3 of 52 slices shown]
[im 1/52]
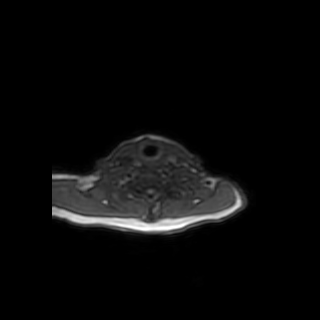
[im 26/52]
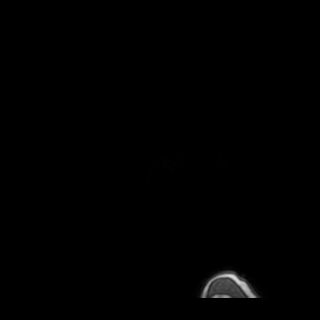
[im 52/52]
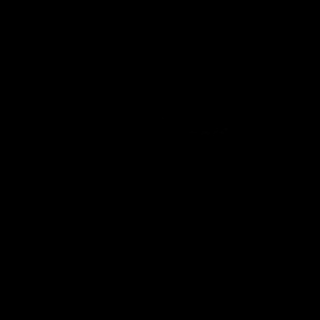

[Series 203: dadc map · axial · 5.0mm · 1.00mm/px · 1 of 25 slices shown (1 of 2)]
[im 1/25]
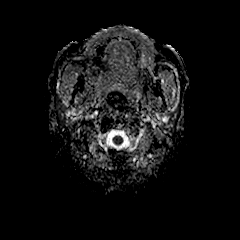

[Series 204: (id) · axial · 5.0mm · 1.00mm/px · 1 of 27 slices shown (1 of 2)]
[im 1/27]
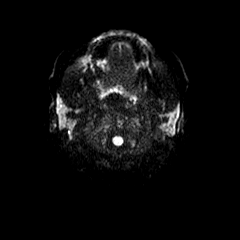

[Series 303: dadc map · coronal · 5.0mm · 0.81mm/px · 1 of 28 slices shown (2 of 2)]
[im 1/28]
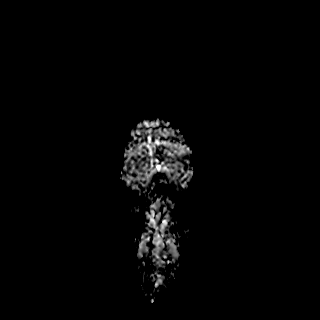

[Series 304: (id) · coronal · 5.0mm · 0.81mm/px · 2 of 30 slices shown (2 of 2)]
[im 1/30]
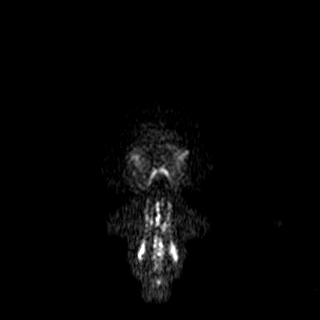
[im 30/30]
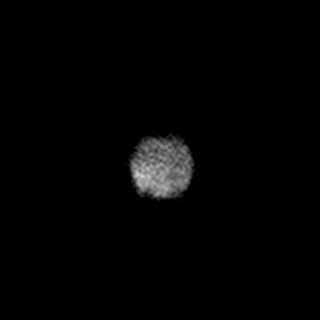

[Series 501: flair_ax+fs · axial · 5.0mm · 0.49mm/px · 1 of 27 slices shown]
[im 1/27]
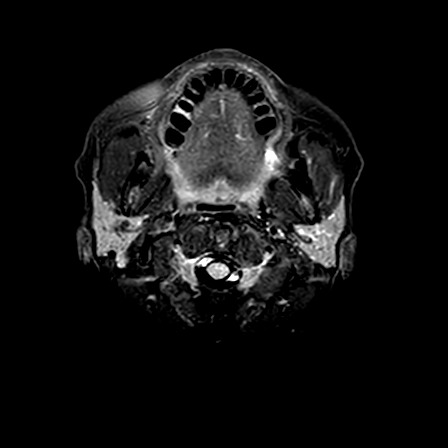

[Series 602: swip · axial · 10.0mm · 0.36mm/px · z∈[-96,+40]mm · 7 of 140 slices shown]
[im 1/140]
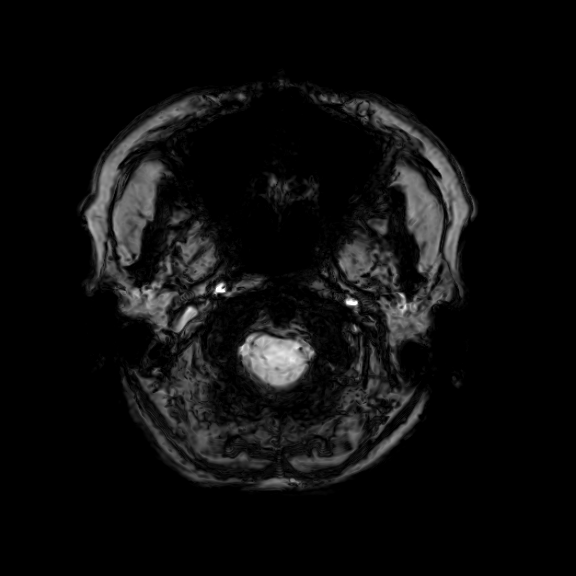
[im 24/140]
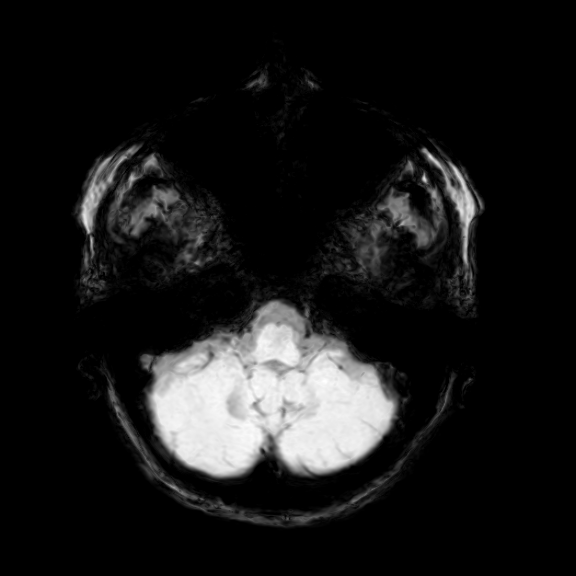
[im 47/140]
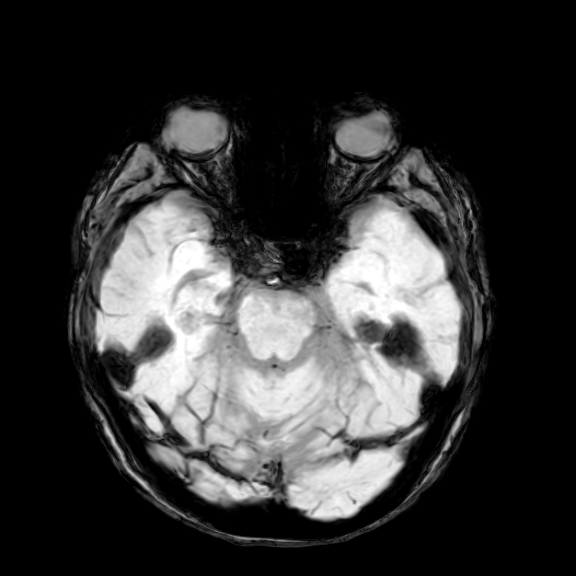
[im 70/140]
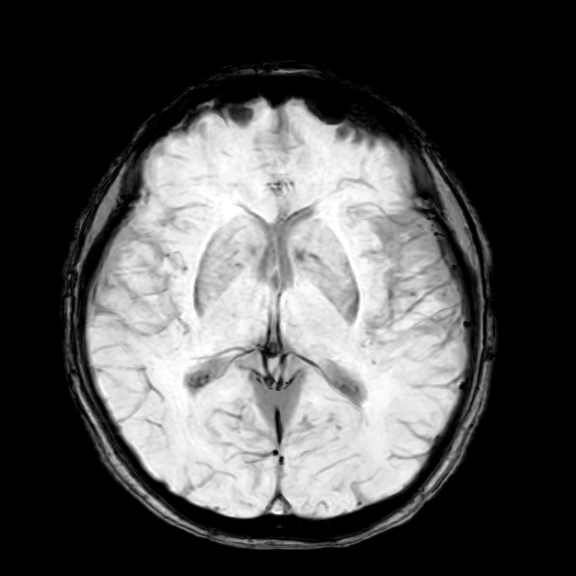
[im 93/140]
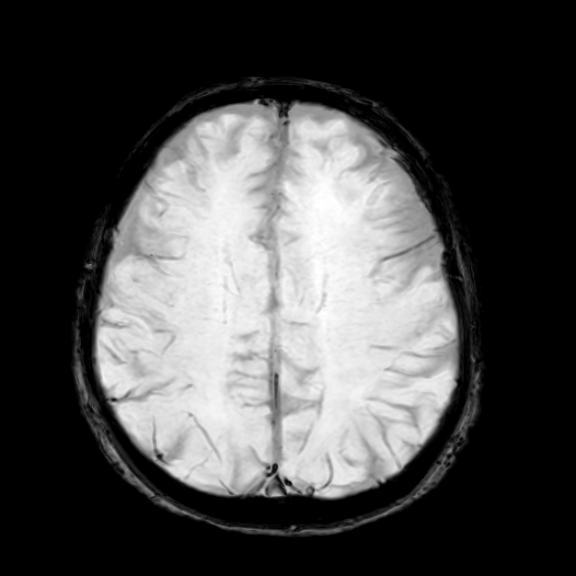
[im 116/140]
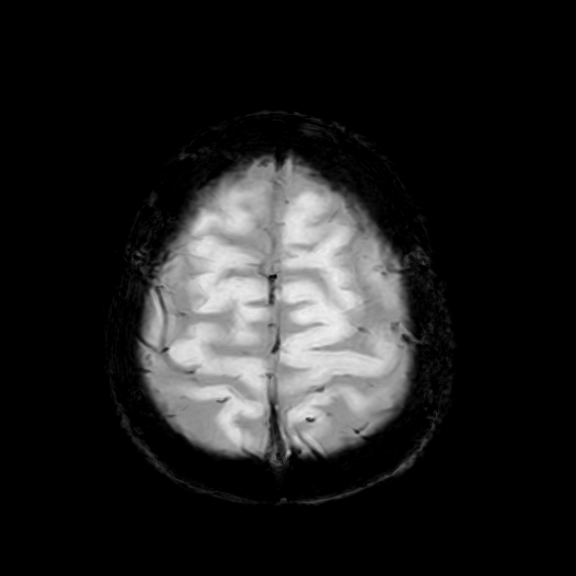
[im 140/140]
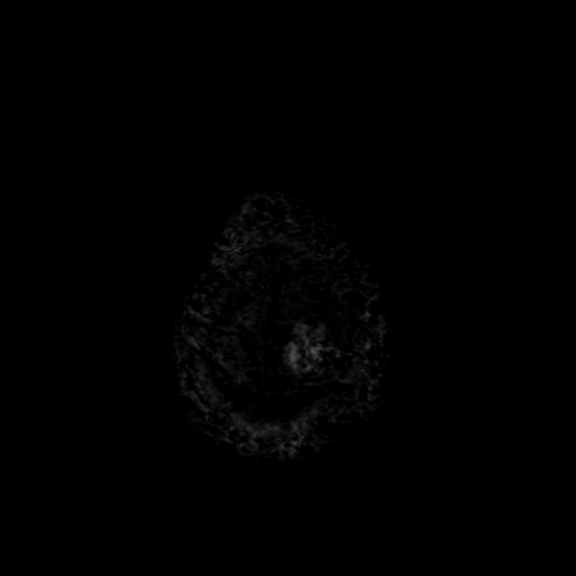

[Series 603: sswi magnitude · axial · 3.0mm · 0.40mm/px · z∈[-108,+38]mm · 5 of 100 slices shown]
[im 1/100]
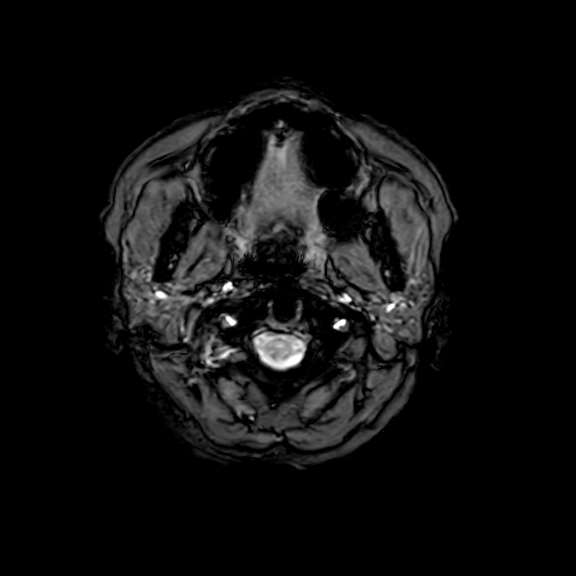
[im 25/100]
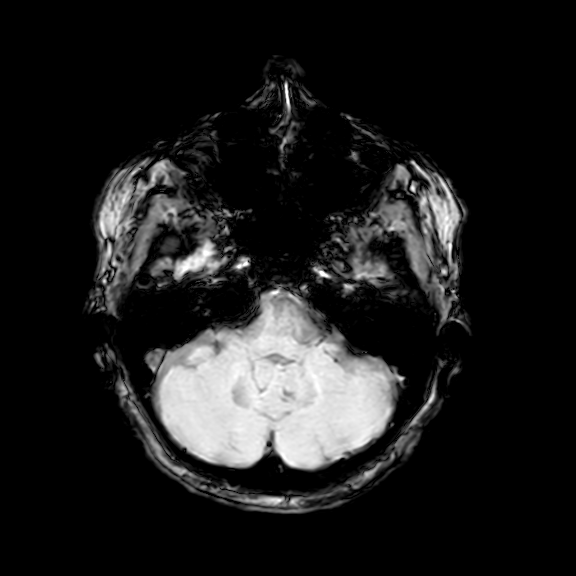
[im 50/100]
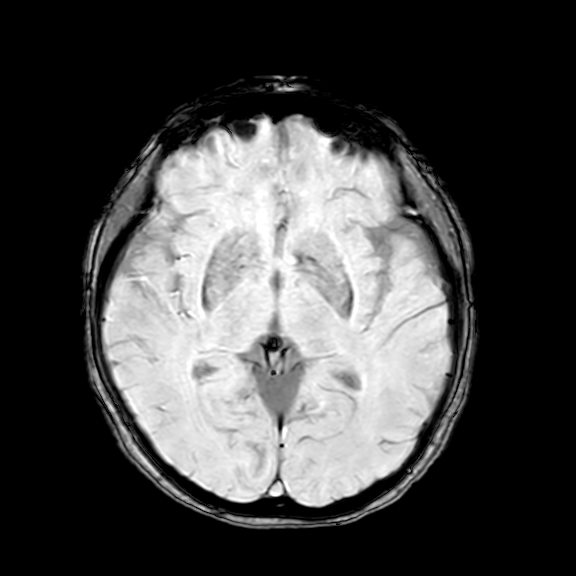
[im 75/100]
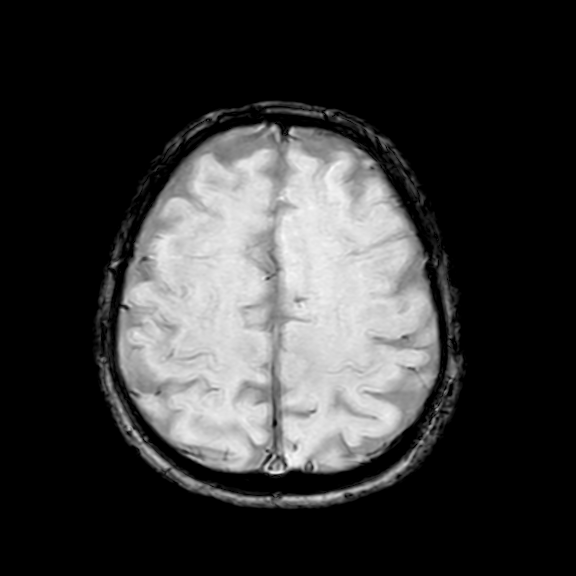
[im 100/100]
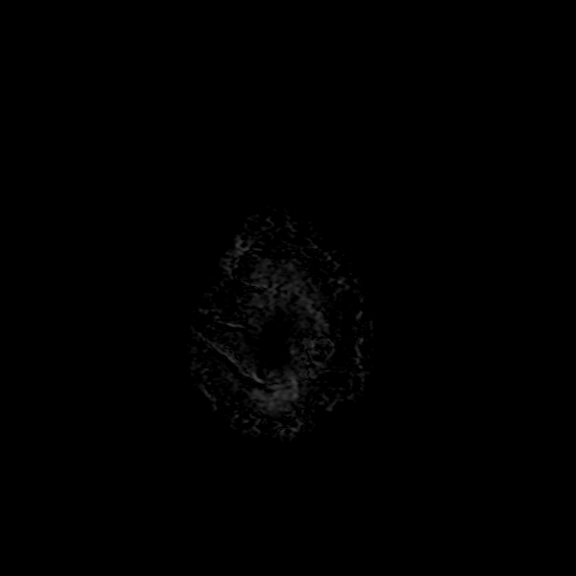

[22 of 48 positions shown; findings below may reference images not displayed]

FINDINGS: -------------------------------------------------------------------------------- 
------------------------- 
INTRACRANIAL: 
No acute ischemia. No abnormal foci of susceptibility artifact in the brain. 
Patency of intracranial vascular flow voids. Mild periventricular white matter 
T2 prolongation, nonspecific. No clear distinct lesions that are typical of 
multiple sclerosis. No acute hemorrhage, midline shift, mass effect.   
-------------------------------------------------------------------------------- 
----------------------- 
OTHER: 
ORBITS/SINUSES/T-BONES:  Visualized orbits show no acute abnormality or mass.  
Mastoid air cells and middle ear cavities are grossly clear.  Visualized 
paranasal sinuses are clear. 
MARROW SIGNAL/SOFT TISSUES: No focal suspect signal abnormality.  
-------------------------------------------------------------------------------- 
-------------------
IMPRESSION: 1.  Stable nonspecific periventricular white matter T2 prolongation. 
2.  No acute abnormality in the brain.

## 2023-11-24 IMAGING — MR MRI CERVICAL SPINE WITHOUT CONTRAST
7 of 12 series · 11 of 48 positions shown · IV contrast (gadolinium)
Comparison: Brain MRI October 15, 2023. Brain MRI September 13, 2022. MRI 
cervical spine August 31, 2021.

________________________________________________________________________________________________ 
MRI CERVICAL SPINE WITHOUT CONTRAST, 11/24/2023 [DATE]: 
CLINICAL INDICATION: Multiple Sclerosis , no current symptoms.
TECHNIQUE: Multiplanar, multiecho position MR images of the cervical spine were 
performed without intravenous gadolinium enhancement. Patient was scanned on a 
1.5T magnet.

[Series 101: survey · axial · 10.0mm · 1.25mm/px · z∈[-6,+200]mm · 2 of 10 slices shown]
[im 1/10]
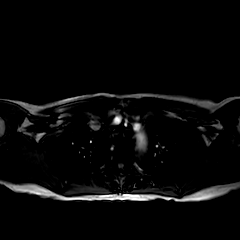
[im 10/10]
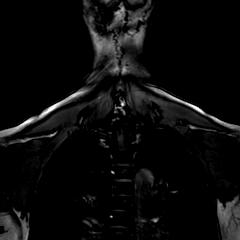

[Series 201: t2w_cor-surv · coronal · 5.0mm · 0.69mm/px · 1 of 7 slices shown]
[im 1/7]
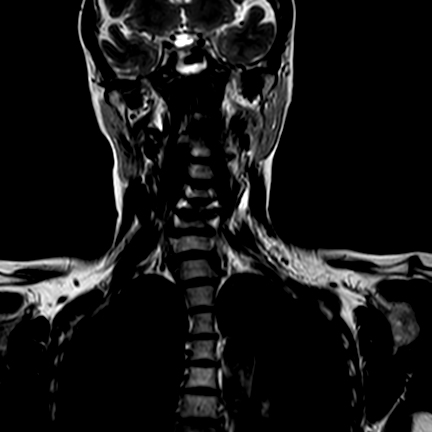

[Series 301: T1 · sagittal · 3.0mm · 0.39mm/px · 1 of 15 slices shown]
[im 1/15]
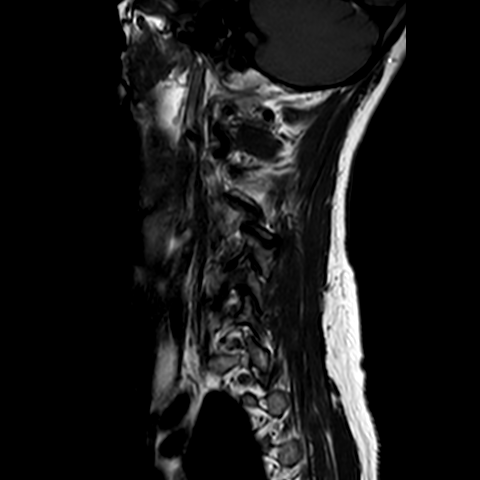

[Series 402: (id)_mdixon_tse · sagittal · 3.0mm · 0.35mm/px · 1 of 15 slices shown]
[im 1/15]
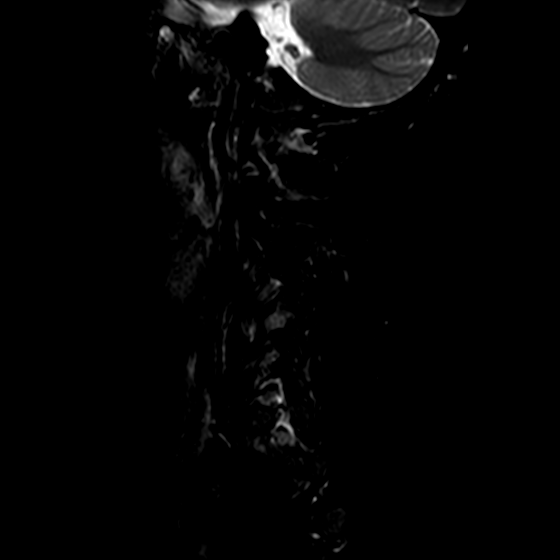

[Series 403: st2w_mdixon_tse · sagittal · 3.0mm · 0.35mm/px · 1 of 15 slices shown]
[im 1/15]
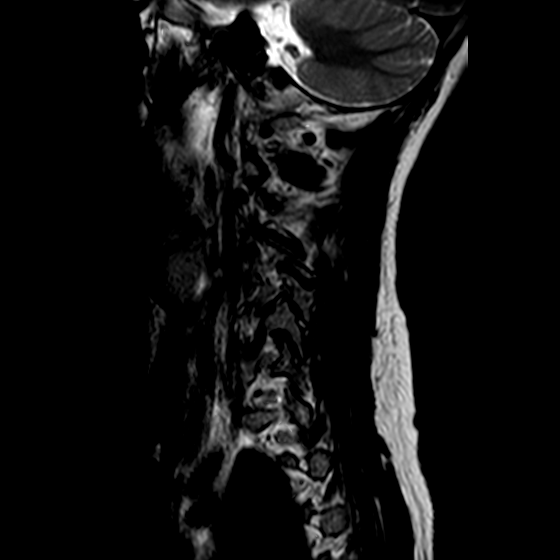

[Series 504: csp left oblq · oblique · 1.0mm · 0.15mm/px · 3 of 32 slices shown]
[im 1/32]
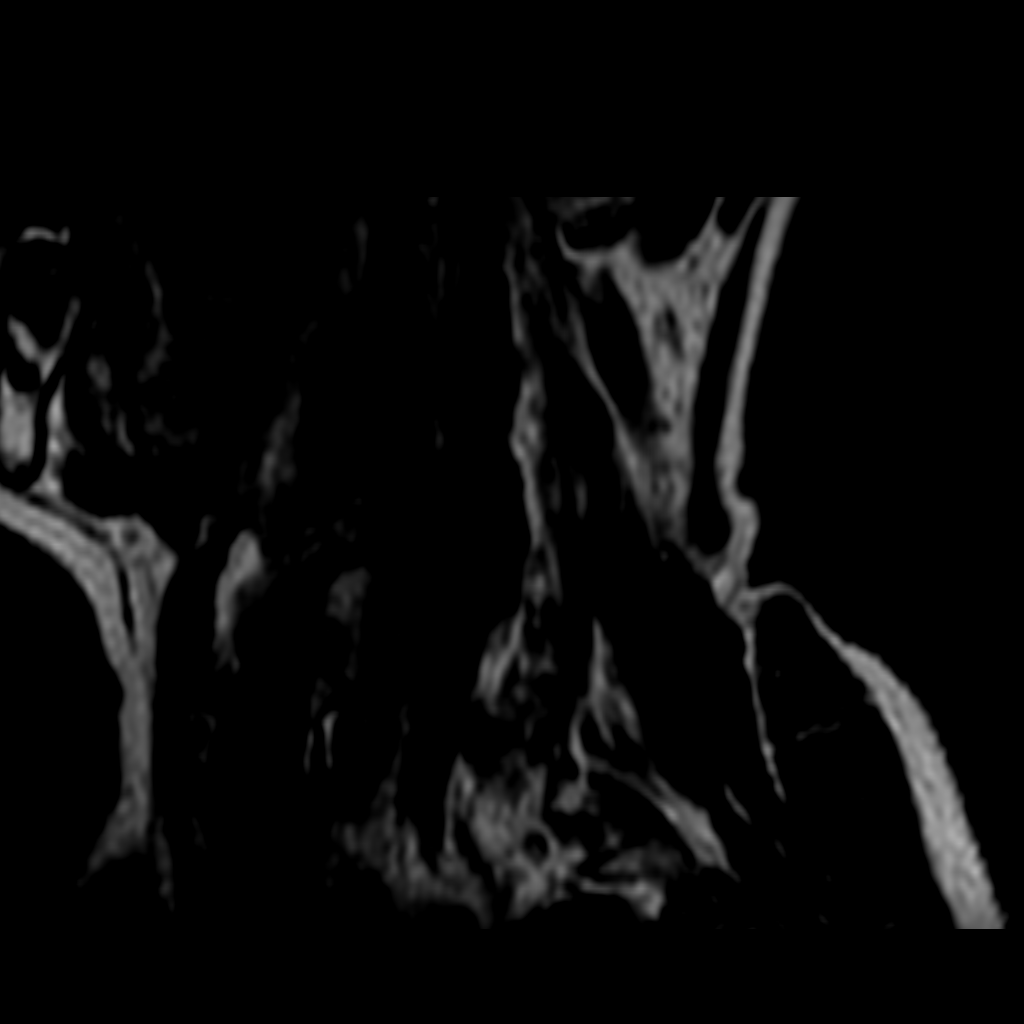
[im 16/32]
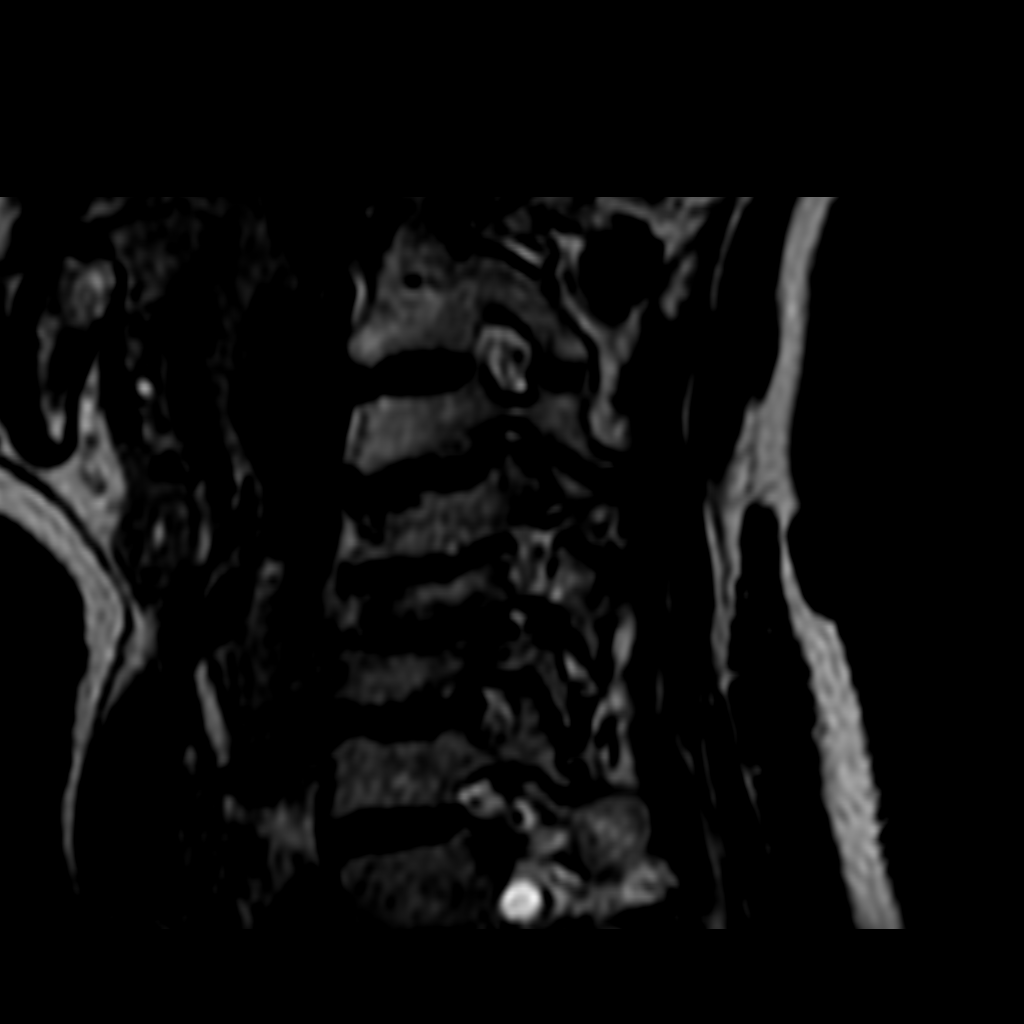
[im 32/32]
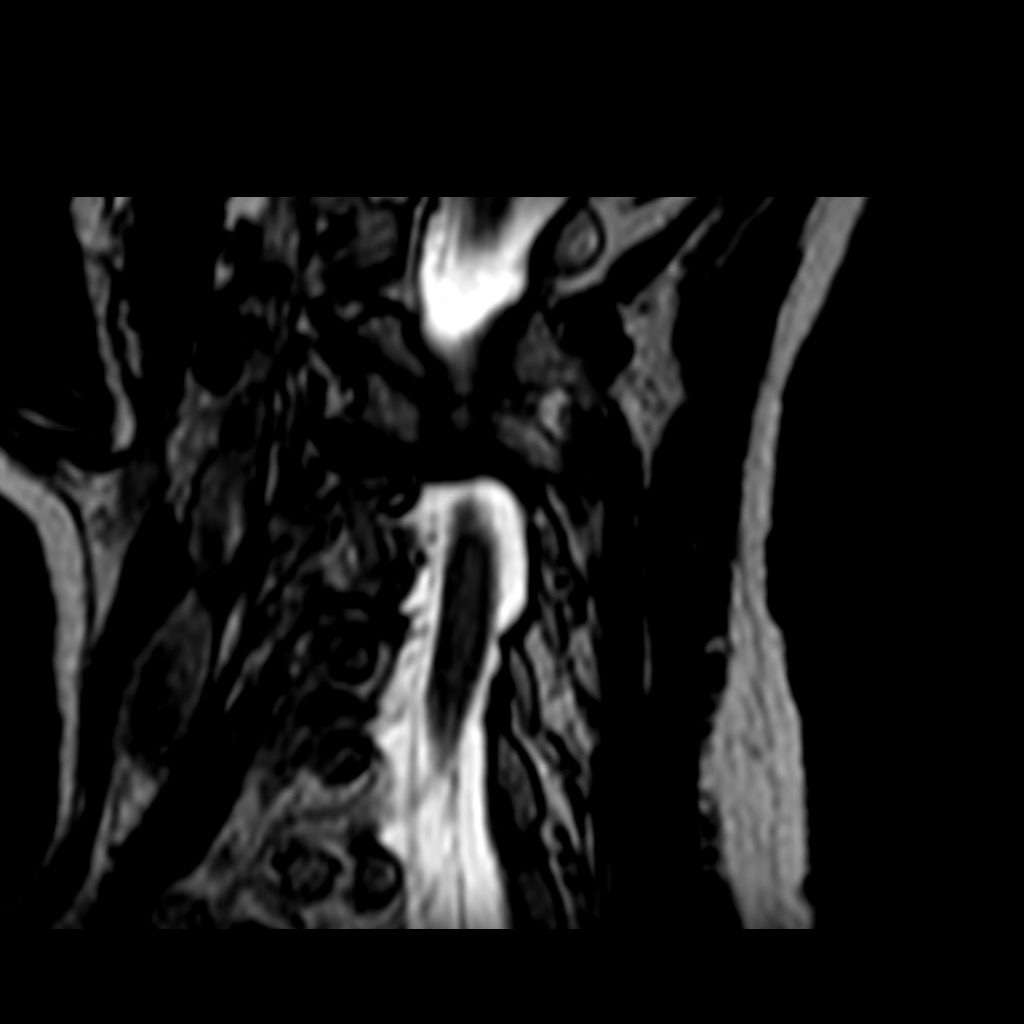

[Series 601: T2 · axial · 3.0mm · 0.29mm/px · z∈[+13,+86]mm · 2 of 18 slices shown]
[im 1/18]
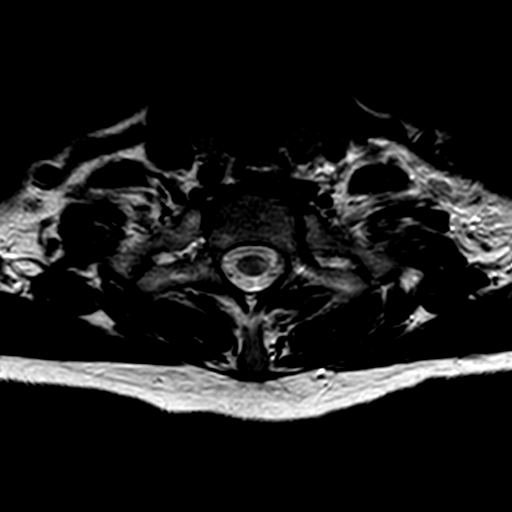
[im 18/18]
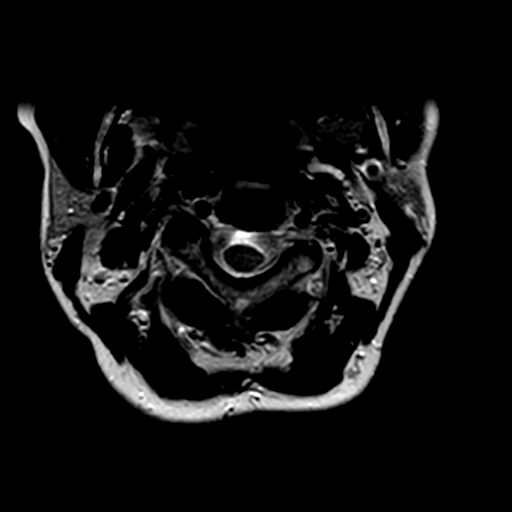

[11 of 48 positions shown; findings below may reference images not displayed]

FINDINGS: -------------------------------------------------------------------------------- 
----------------- 
GENERAL: 
ALIGNMENT: Mild dextroconvex cervical thoracic scoliosis. Minimal levoconvex 
thoracic scoliosis. Loss of the normal cervical lordosis with mild cervical 
kyphosis. Grade 1 anterolisthesis C3 on C4 with grade 1 retrolisthesis C5 on C6 
and C6 on C7. There is trace grade 1 anterolisthesis C7 on T1. Alignment stable. 
VERTEBRAL BODY HEIGHT: Degenerative type mild to moderate loss of vertebral body 
height along the anterior and mid aspects of C5. Chronic and stable.  
MARROW SIGNAL: No evidence of a marrow infiltrative process. Type I Modic 
endplate signal changes C4-C5 and C5-C6 and C6-C7. Mild edema extends into the 
left posterior elements at C7, likely reactive and to a lesser degree at T1. 
CORD SIGNAL: Stable appearing abnormal cord signal in the left lateral cord 
measuring 3 mm AP. Stable appearing focal abnormal cord signal within the left 
lateral cord at T1, measuring 3 mm AP.  
ADDITIONAL FINDINGS: None. 
-------------------------------------------------------------------------------- 
---------------- 
SEGMENTAL: 
CRANIOCERVICAL JUNCTION: No significant stenosis. 5 mm cyst within the dens. 
C2-C3: Normal disc height. Patent canal and foramina. Right facet arthropathy. 
C3-C4: Mild loss of disc height. Disc uncovering. Ventral cord flattening with 
mild canal stenosis from disc osteophyte complex. Bilateral uncinate spurring 
and facet arthropathy with severe right and left foraminal narrowing. 
C4-C5: Loss of disc height. Flattening of the ventral cord margin from disc 
osteophyte complex with mild canal stenosis. Bilateral uncinate spurring with 
mild to moderate right foraminal narrowing. Left foramen is patent. 
C5-C6: Loss of disc height. Disc osteophyte complex abuts and flattens the 
ventral cord margin with mild canal stenosis. Uncinate spurring and facet 
arthropathy with moderate to moderately severe right and severe left foraminal 
narrowing. 
C6-C7: Loss of disc height. Canal patent. Uncinate spurring with left-sided 
facet arthropathy. Mild right and moderate left foraminal narrowing. 
C7-T1: Left facet arthropathy. Borderline left foraminal narrowing. Canal and 
right foramina patent. 
-------------------------------------------------------------------------------- 
---------------
IMPRESSION: Degenerative and scoliotic changes. 
There are prominent type I Modic endplate signal changes C5-C6 which may be a 
pain generator. Additionally, there is edema within the left posterior elements 
C7-T1 which may be reactive. 
Stable abnormal cord signal in the left lateral cord at C3 and T1 levels 
consistent with chronic demyelinating plaques. 
Multilevel significant foraminal narrowing detailed above.

## 2023-12-23 ENCOUNTER — Encounter: Admit: 2023-12-23 | Payer: PRIVATE HEALTH INSURANCE | Attending: Family Medicine | Primary: Family Medicine

## 2023-12-24 MED ORDER — DILTIAZEM CD 240 MG CAPSULE,EXTENDED RELEASE 24 HR
240 | ORAL_CAPSULE | 1 refills | 90.00000 days | Status: AC
Start: 2023-12-24 — End: 2024-03-17

## 2024-01-14 IMAGING — MG MAMMOGRAPHY SCREENING BILATERAL 3[PERSON_NAME]
8 series · 8 of 24 positions shown · non-contrast
Comparison: Comparison was made to prior examinations.

________________________________________________________________________________________________ 
MAMMOGRAPHY SCREENING BILATERAL [id number] AUJLA, 01/14/2024 [DATE]: 
CLINICAL INDICATION: Encounter for screening mammogram.
TECHNIQUE: Digital bilateral mammograms and 3-D Tomosynthesis were obtained. 
These were interpreted both primarily and with the aid of computer-aided 
detection system.  
BREAST DENSITY: (Level C) The breasts are heterogeneously dense, which may 
obscure small masses.

[R CC]
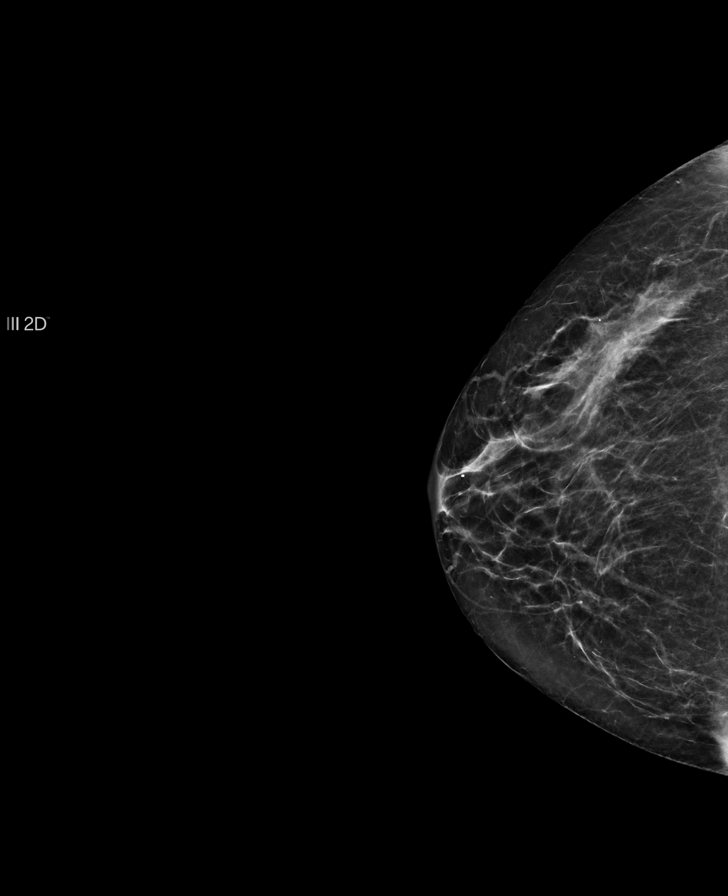

[R MLO]
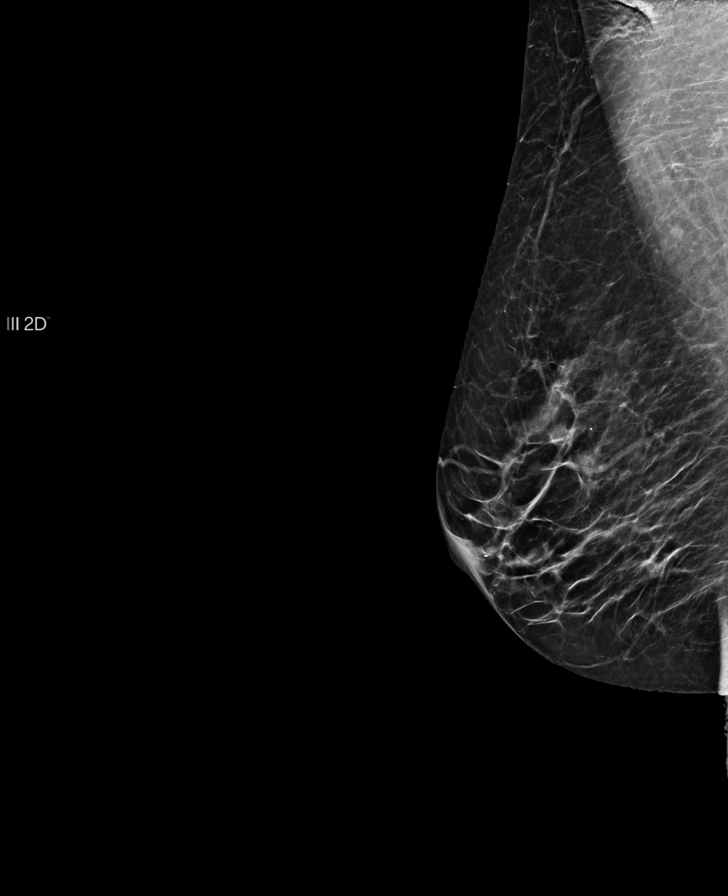

[L MLO]
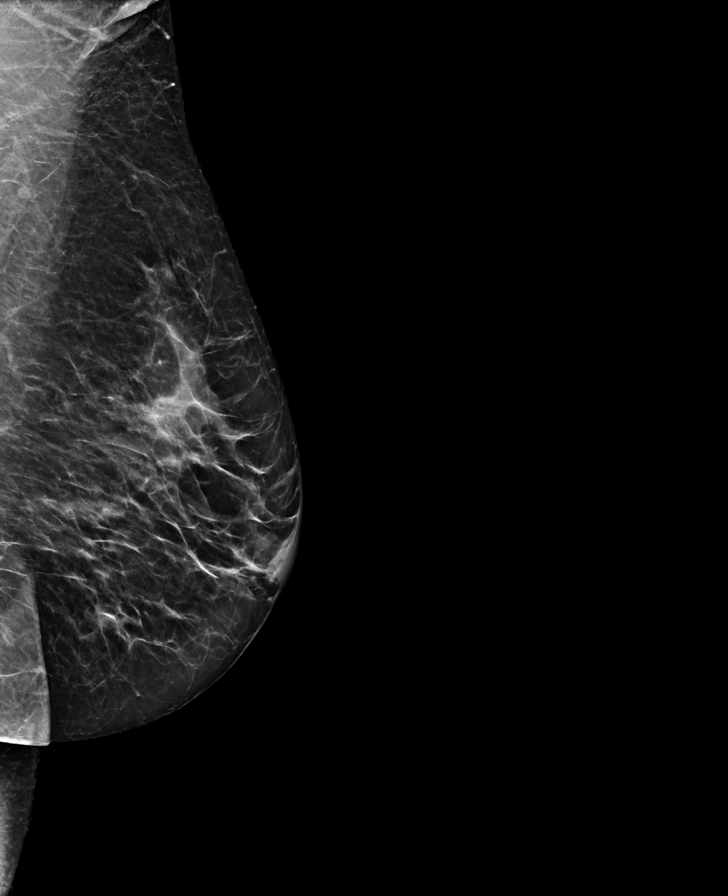

[L CC]
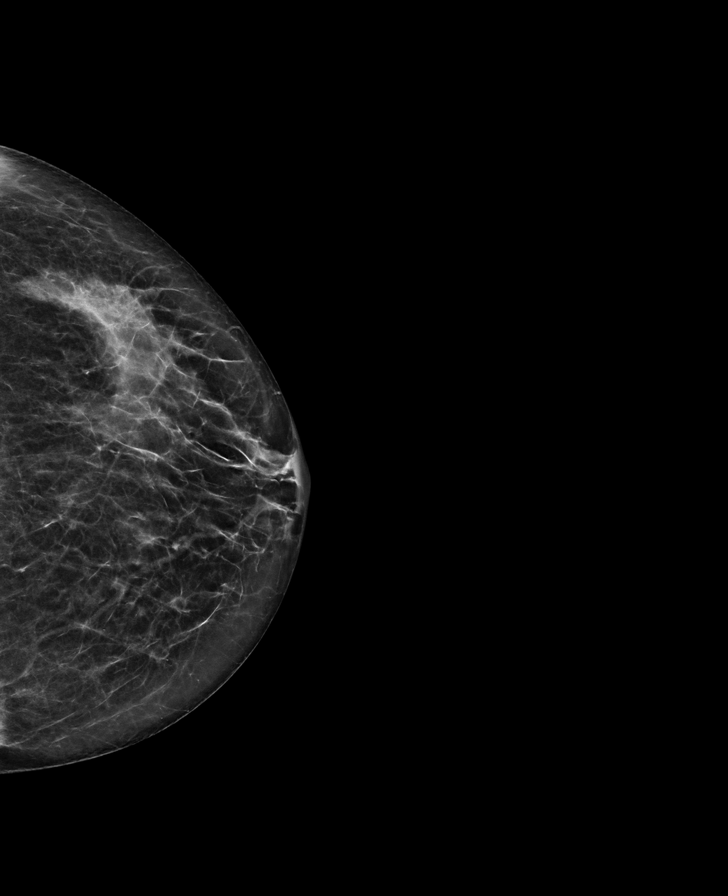

[R CC tomo · tomo slice 9/18.0]
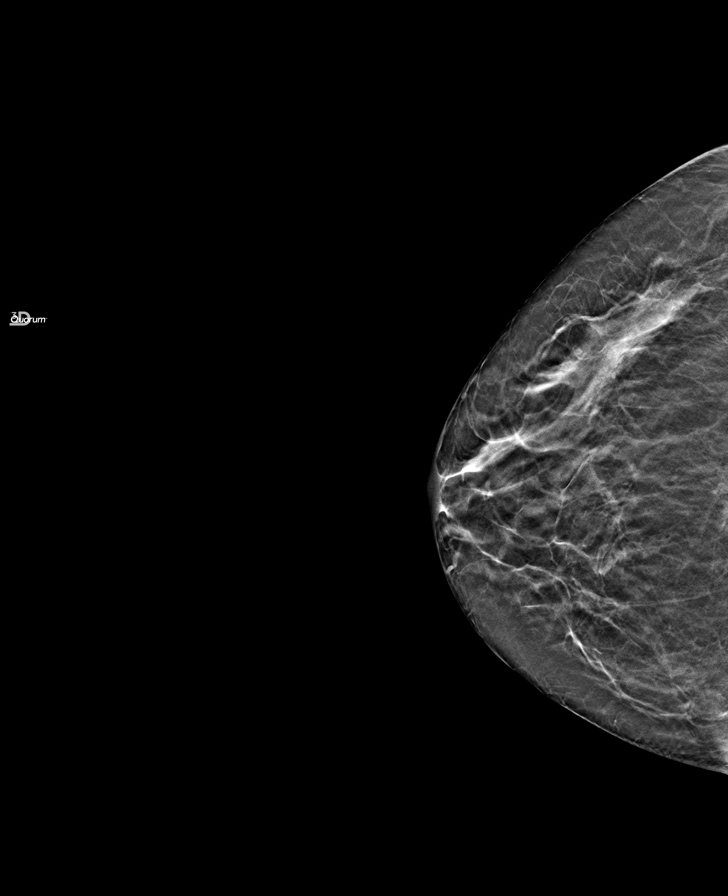

[L CC tomo · tomo slice 9/18.0]
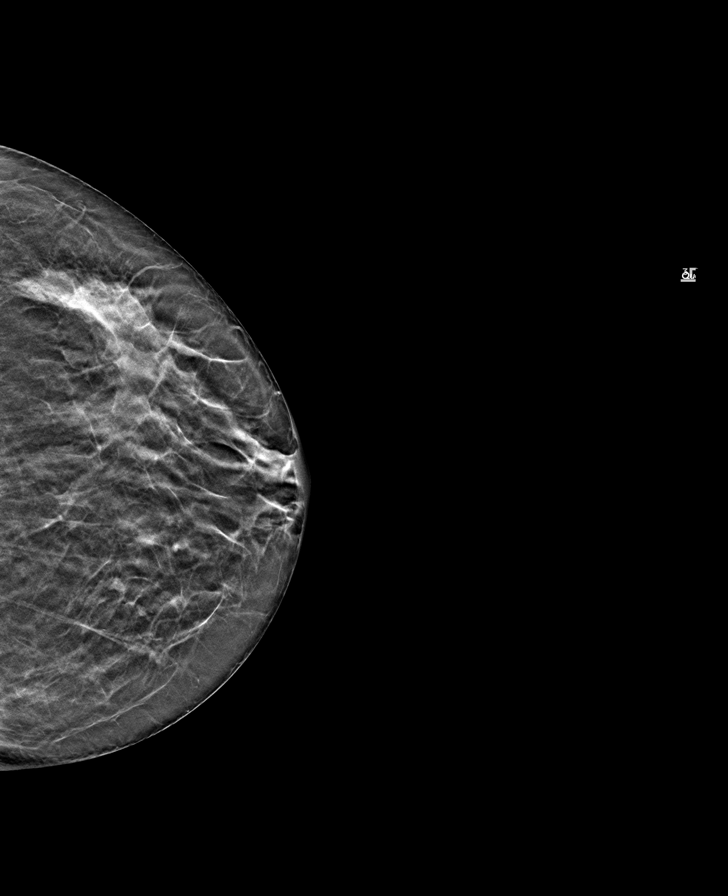

[R MLO tomo · tomo slice 9/18.0]
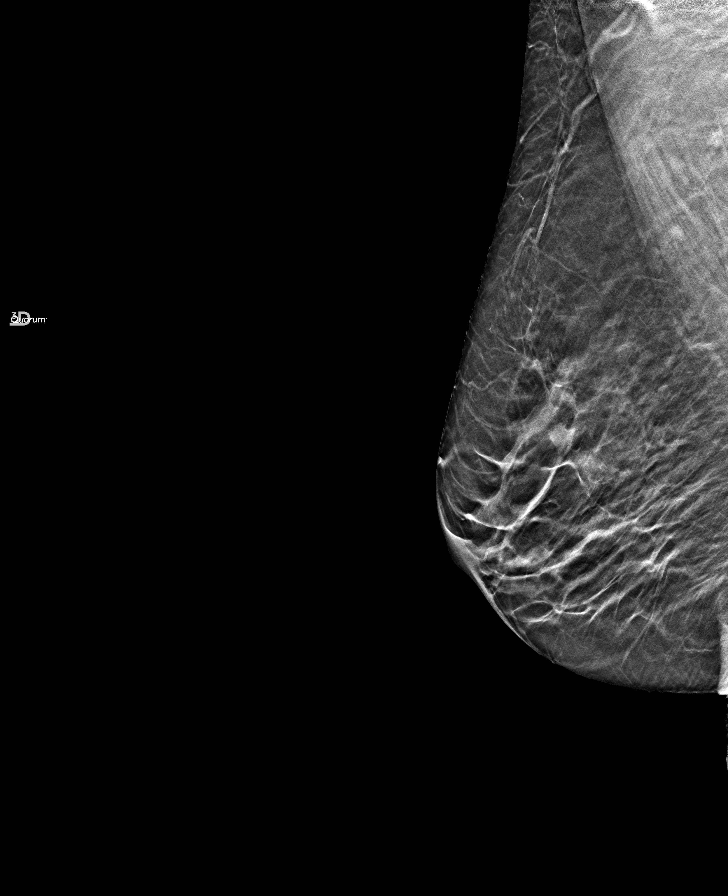

[L MLO tomo · tomo slice 10/19.0]
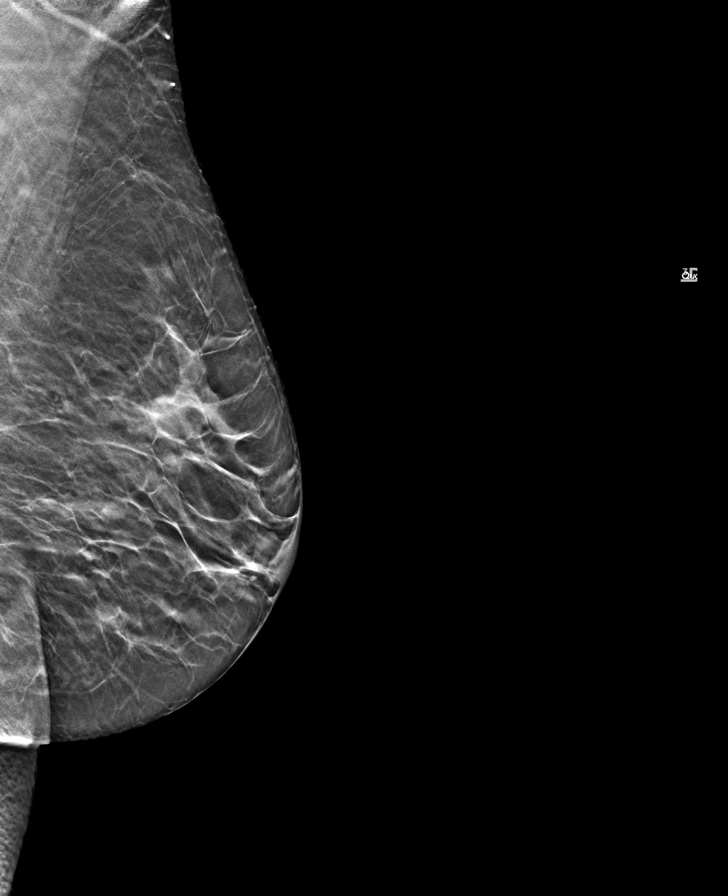

[8 of 24 positions shown; findings below may reference images not displayed]

FINDINGS: No suspicious mass, calcifications, or area of architectural 
distortion in either breast.
IMPRESSION: (BI-RADS 2) Benign findings. Routine mammographic follow-up is recommended.

## 2024-02-22 ENCOUNTER — Encounter
Admit: 2024-02-22 | Payer: PRIVATE HEALTH INSURANCE | Attending: Vascular and Interventional Radiology | Primary: Family Medicine

## 2024-03-17 MED ORDER — DILTIAZEM CD 240 MG CAPSULE,EXTENDED RELEASE 24 HR
240 | ORAL_CAPSULE | Freq: Every day | 1 refills | 90.00000 days | Status: AC
Start: 2024-03-17 — End: ?

## 2024-04-21 ENCOUNTER — Ambulatory Visit: Admit: 2024-04-21 | Payer: PRIVATE HEALTH INSURANCE | Primary: Family Medicine

## 2024-04-21 ENCOUNTER — Inpatient Hospital Stay: Admit: 2024-04-21 | Discharge: 2024-04-21 | Payer: PRIVATE HEALTH INSURANCE | Primary: Family Medicine

## 2024-04-21 DIAGNOSIS — N83209 Unspecified ovarian cyst, unspecified side: Principal | ICD-10-CM

## 2024-04-21 DIAGNOSIS — N83201 Unspecified ovarian cyst, right side: Secondary | ICD-10-CM

## 2024-06-13 ENCOUNTER — Encounter: Admit: 2024-06-13 | Payer: PRIVATE HEALTH INSURANCE | Attending: Family Medicine

## 2024-06-18 ENCOUNTER — Inpatient Hospital Stay: Admit: 2024-06-18 | Discharge: 2024-06-18 | Payer: Managed Care (Private)

## 2024-06-18 ENCOUNTER — Encounter: Admit: 2024-06-18 | Payer: PRIVATE HEALTH INSURANCE

## 2024-06-18 DIAGNOSIS — R1032 Left lower quadrant pain: Principal | ICD-10-CM

## 2024-06-18 LAB — BUN/CREATININE/EGFR     (BH GH LMW Q YH)
BKR BLOOD UREA NITROGEN: 11 mg/dL (ref 7–18)
BKR BUN / CREAT RATIO: 14.1
BKR CREATININE: 0.78 mg/dL (ref 0.55–1.02)
BKR EGFR, CREATININE (CKD-EPI 2021): >60 mL/min/1.73m2 (ref >=60)

## 2024-06-18 MED ORDER — SODIUM CHLORIDE 0.9 % LARGE VOLUME SYRINGE FOR AUTOINJECTOR
Freq: Once | INTRAVENOUS | Status: CP | PRN
Start: 2024-06-18 — End: ?
  Administered 2024-06-18: 15:00:00 via INTRAVENOUS

## 2024-06-18 MED ORDER — IOHEXOL 350 MG IODINE/ML INTRAVENOUS SOLUTION
350 | Freq: Once | INTRAVENOUS | Status: CP | PRN
Start: 2024-06-18 — End: ?
  Administered 2024-06-18: 15:00:00 350 mL via INTRAVENOUS

## 2024-06-27 ENCOUNTER — Encounter: Admit: 2024-06-27 | Payer: PRIVATE HEALTH INSURANCE

## 2024-06-27 DIAGNOSIS — N83299 Other ovarian cyst, unspecified side: Principal | ICD-10-CM

## 2025-04-27 ENCOUNTER — Ambulatory Visit: Admit: 2025-04-27 | Payer: PRIVATE HEALTH INSURANCE
